# Patient Record
Sex: Male | Born: 1965 | Race: White | Hispanic: No | Marital: Married | Smoking: Current every day smoker
Health system: Southern US, Community
[De-identification: ages and names within clinical notes are randomized; demographics above are authoritative.]

## PROBLEM LIST (undated history)

## (undated) DIAGNOSIS — T07XXXA Unspecified multiple injuries, initial encounter: Secondary | ICD-10-CM

## (undated) DIAGNOSIS — J189 Pneumonia, unspecified organism: Secondary | ICD-10-CM

## (undated) DIAGNOSIS — S06309S Unspecified focal traumatic brain injury with loss of consciousness of unspecified duration, sequela: Secondary | ICD-10-CM

## (undated) DIAGNOSIS — K921 Melena: Secondary | ICD-10-CM

## (undated) DIAGNOSIS — J9621 Acute and chronic respiratory failure with hypoxia: Secondary | ICD-10-CM

## (undated) DIAGNOSIS — G4733 Obstructive sleep apnea (adult) (pediatric): Secondary | ICD-10-CM

---
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
## 2016-02-12 DIAGNOSIS — K921 Melena: Secondary | ICD-10-CM

## 2016-02-12 HISTORY — DX: Melena: K92.1

## 2018-07-13 HISTORY — PX: SPLENECTOMY: SUR1306

## 2018-07-13 HISTORY — PX: OPEN REDUCTION INTERNAL FIXATION (ORIF) TIBIA/FIBULA FRACTURE: SHX5992

## 2018-08-21 ENCOUNTER — Other Ambulatory Visit (HOSPITAL_COMMUNITY): Payer: BC Managed Care – PPO

## 2018-08-21 ENCOUNTER — Inpatient Hospital Stay
Admission: RE | Admit: 2018-08-21 | Discharge: 2018-09-08 | Disposition: A | Discharge status: Other Acute Inpatient Hospital | Payer: BC Managed Care – PPO | Source: Other Acute Inpatient Hospital | Attending: Urology | Admitting: Urology

## 2018-08-21 DIAGNOSIS — G4733 Obstructive sleep apnea (adult) (pediatric): Secondary | ICD-10-CM | POA: Diagnosis present

## 2018-08-21 DIAGNOSIS — Z4659 Encounter for fitting and adjustment of other gastrointestinal appliance and device: Secondary | ICD-10-CM

## 2018-08-21 DIAGNOSIS — J9621 Acute and chronic respiratory failure with hypoxia: Secondary | ICD-10-CM | POA: Diagnosis present

## 2018-08-21 DIAGNOSIS — Z931 Gastrostomy status: Secondary | ICD-10-CM

## 2018-08-21 DIAGNOSIS — T07XXXA Unspecified multiple injuries, initial encounter: Secondary | ICD-10-CM | POA: Diagnosis present

## 2018-08-21 DIAGNOSIS — S06309S Unspecified focal traumatic brain injury with loss of consciousness of unspecified duration, sequela: Secondary | ICD-10-CM

## 2018-08-21 DIAGNOSIS — J189 Pneumonia, unspecified organism: Secondary | ICD-10-CM | POA: Diagnosis present

## 2018-08-21 HISTORY — DX: Unspecified multiple injuries, initial encounter: T07.XXXA

## 2018-08-21 HISTORY — DX: Unspecified focal traumatic brain injury with loss of consciousness of unspecified duration, sequela: S06.309S

## 2018-08-21 HISTORY — DX: Pneumonia, unspecified organism: J18.9

## 2018-08-21 HISTORY — DX: Acute and chronic respiratory failure with hypoxia: J96.21

## 2018-08-21 HISTORY — DX: Obstructive sleep apnea (adult) (pediatric): G47.33

## 2018-08-21 LAB — URINALYSIS, ROUTINE W REFLEX MICROSCOPIC
Bilirubin Urine: NEGATIVE
Glucose, UA: NEGATIVE mg/dL
Ketones, ur: NEGATIVE mg/dL
Leukocytes,Ua: NEGATIVE
Nitrite: NEGATIVE
Protein, ur: 30 mg/dL — AB
Specific Gravity, Urine: 1.018 (ref 1.005–1.030)
pH: 8 (ref 5.0–8.0)

## 2018-08-21 LAB — C DIFFICILE QUICK SCREEN W PCR REFLEX
C Diff antigen: NEGATIVE
C Diff interpretation: NOT DETECTED
C Diff toxin: NEGATIVE

## 2018-08-22 LAB — CBC WITH DIFFERENTIAL/PLATELET
Abs Immature Granulocytes: 0.06 10*3/uL (ref 0.00–0.07)
Basophils Absolute: 0 10*3/uL (ref 0.0–0.1)
Basophils Relative: 0 %
Eosinophils Absolute: 0.7 10*3/uL — ABNORMAL HIGH (ref 0.0–0.5)
Eosinophils Relative: 6 %
HCT: 38.5 % — ABNORMAL LOW (ref 39.0–52.0)
Hemoglobin: 10.9 g/dL — ABNORMAL LOW (ref 13.0–17.0)
Immature Granulocytes: 1 %
Lymphocytes Relative: 10 %
Lymphs Abs: 1.3 10*3/uL (ref 0.7–4.0)
MCH: 29 pg (ref 26.0–34.0)
MCHC: 28.3 g/dL — ABNORMAL LOW (ref 30.0–36.0)
MCV: 102.4 fL — ABNORMAL HIGH (ref 80.0–100.0)
Monocytes Absolute: 0.9 10*3/uL (ref 0.1–1.0)
Monocytes Relative: 7 %
Neutro Abs: 9.9 10*3/uL — ABNORMAL HIGH (ref 1.7–7.7)
Neutrophils Relative %: 76 %
Platelets: 246 10*3/uL (ref 150–400)
RBC: 3.76 MIL/uL — ABNORMAL LOW (ref 4.22–5.81)
RDW: 18 % — ABNORMAL HIGH (ref 11.5–15.5)
WBC: 12.9 10*3/uL — ABNORMAL HIGH (ref 4.0–10.5)
nRBC: 0 % (ref 0.0–0.2)

## 2018-08-22 LAB — COMPREHENSIVE METABOLIC PANEL
ALT: 95 U/L — ABNORMAL HIGH (ref 0–44)
AST: 145 U/L — ABNORMAL HIGH (ref 15–41)
Albumin: 1.8 g/dL — ABNORMAL LOW (ref 3.5–5.0)
Alkaline Phosphatase: 100 U/L (ref 38–126)
Anion gap: 9 (ref 5–15)
BUN: 17 mg/dL (ref 6–20)
CO2: 21 mmol/L — ABNORMAL LOW (ref 22–32)
Calcium: 7.9 mg/dL — ABNORMAL LOW (ref 8.9–10.3)
Chloride: 110 mmol/L (ref 98–111)
Creatinine, Ser: 0.39 mg/dL — ABNORMAL LOW (ref 0.61–1.24)
GFR calc Af Amer: >60 mL/min (ref >60)
GFR calc non Af Amer: >60 mL/min (ref >60)
Glucose, Bld: 71 mg/dL (ref 70–99)
Potassium: 5.5 mmol/L — ABNORMAL HIGH (ref 3.5–5.1)
Sodium: 140 mmol/L (ref 135–145)
Total Bilirubin: 1.5 mg/dL — ABNORMAL HIGH (ref 0.3–1.2)
Total Protein: 5.3 g/dL — ABNORMAL LOW (ref 6.5–8.1)

## 2018-08-22 LAB — PHOSPHORUS: Phosphorus: 3 mg/dL (ref 2.5–4.6)

## 2018-08-22 LAB — MAGNESIUM: Magnesium: 1.7 mg/dL (ref 1.7–2.4)

## 2018-08-22 LAB — PROTIME-INR
INR: 1.1 (ref 0.8–1.2)
Prothrombin Time: 13.6 seconds (ref 11.4–15.2)

## 2018-08-22 LAB — HEMOGLOBIN A1C
Hgb A1c MFr Bld: 4.3 % — ABNORMAL LOW (ref 4.8–5.6)
Mean Plasma Glucose: 76.71 mg/dL

## 2018-08-22 LAB — TSH: TSH: 2.199 u[IU]/mL (ref 0.350–4.500)

## 2018-08-23 LAB — URINE CULTURE: Culture: <10000 — AB

## 2018-08-23 LAB — MAGNESIUM: Magnesium: 1.6 mg/dL — ABNORMAL LOW (ref 1.7–2.4)

## 2018-08-23 LAB — POTASSIUM: Potassium: 3.6 mmol/L (ref 3.5–5.1)

## 2018-08-24 ENCOUNTER — Encounter: Payer: Self-pay | Admitting: Internal Medicine

## 2018-08-24 DIAGNOSIS — J189 Pneumonia, unspecified organism: Secondary | ICD-10-CM

## 2018-08-24 DIAGNOSIS — S06309S Unspecified focal traumatic brain injury with loss of consciousness of unspecified duration, sequela: Secondary | ICD-10-CM

## 2018-08-24 DIAGNOSIS — T07XXXA Unspecified multiple injuries, initial encounter: Secondary | ICD-10-CM | POA: Diagnosis present

## 2018-08-24 DIAGNOSIS — G4733 Obstructive sleep apnea (adult) (pediatric): Secondary | ICD-10-CM | POA: Diagnosis not present

## 2018-08-24 DIAGNOSIS — J9621 Acute and chronic respiratory failure with hypoxia: Secondary | ICD-10-CM

## 2018-08-24 LAB — CULTURE, RESPIRATORY W GRAM STAIN

## 2018-08-24 MED ORDER — GENERIC EXTERNAL MEDICATION
1.00 | Status: DC
Start: 2018-08-22 — End: 2018-08-24

## 2018-08-24 MED ORDER — IPRATROPIUM-ALBUTEROL 0.5-2.5 (3) MG/3ML IN SOLN
3.00 | RESPIRATORY_TRACT | Status: DC
Start: ? — End: 2018-08-24

## 2018-08-24 MED ORDER — ASPIRIN 81 MG PO CHEW
324.00 | CHEWABLE_TABLET | ORAL | Status: DC
Start: 2018-08-22 — End: 2018-08-24

## 2018-08-24 MED ORDER — GENERIC EXTERNAL MEDICATION
2.00 | Status: DC
Start: ? — End: 2018-08-24

## 2018-08-24 MED ORDER — GENERIC EXTERNAL MEDICATION
10.00 | Status: DC
Start: 2018-08-21 — End: 2018-08-24

## 2018-08-24 MED ORDER — CHLORHEXIDINE GLUCONATE 0.12 % MT SOLN
15.00 | OROMUCOSAL | Status: DC
Start: 2018-08-21 — End: 2018-08-24

## 2018-08-24 MED ORDER — INSULIN GLARGINE 100 UNIT/ML SOLOSTAR PEN
55.00 | PEN_INJECTOR | SUBCUTANEOUS | Status: DC
Start: 2018-08-21 — End: 2018-08-24

## 2018-08-24 MED ORDER — ACETAMINOPHEN 325 MG PO TABS
325.00 | ORAL_TABLET | ORAL | Status: DC
Start: 2018-08-21 — End: 2018-08-24

## 2018-08-24 MED ORDER — CHLORHEXIDINE GLUCONATE 0.12 % MT SOLN
15.00 | OROMUCOSAL | Status: DC
Start: ? — End: 2018-08-24

## 2018-08-24 MED ORDER — INSULIN LISPRO 100 UNIT/ML ~~LOC~~ SOLN
2.00 | SUBCUTANEOUS | Status: DC
Start: 2018-08-21 — End: 2018-08-24

## 2018-08-24 MED ORDER — ENOXAPARIN SODIUM 30 MG/0.3ML ~~LOC~~ SOLN
30.00 | SUBCUTANEOUS | Status: DC
Start: 2018-08-21 — End: 2018-08-24

## 2018-08-24 MED ORDER — FA-PYRIDOXINE-CYANOCOBALAMIN 2.5-25-2 MG PO TABS
1.00 | ORAL_TABLET | ORAL | Status: DC
Start: 2018-08-22 — End: 2018-08-24

## 2018-08-24 MED ORDER — DEXTROSE 10 % IV SOLN
125.00 | INTRAVENOUS | Status: DC
Start: ? — End: 2018-08-24

## 2018-08-24 MED ORDER — INSULIN LISPRO 100 UNIT/ML ~~LOC~~ SOLN
10.00 | SUBCUTANEOUS | Status: DC
Start: 2018-08-21 — End: 2018-08-24

## 2018-08-24 MED ORDER — GENERIC EXTERNAL MEDICATION
Status: DC
Start: ? — End: 2018-08-24

## 2018-08-24 MED ORDER — POLYETHYLENE GLYCOL 3350 17 G PO PACK
17.00 | PACK | ORAL | Status: DC
Start: 2018-08-22 — End: 2018-08-24

## 2018-08-24 MED ORDER — CITALOPRAM HYDROBROMIDE 20 MG PO TABS
20.00 | ORAL_TABLET | ORAL | Status: DC
Start: 2018-08-22 — End: 2018-08-24

## 2018-08-24 MED ORDER — SENNOSIDES-DOCUSATE SODIUM 8.6-50 MG PO TABS
2.00 | ORAL_TABLET | ORAL | Status: DC
Start: 2018-08-21 — End: 2018-08-24

## 2018-08-24 MED ORDER — GENERIC EXTERNAL MEDICATION
750.00 | Status: DC
Start: 2018-08-21 — End: 2018-08-24

## 2018-08-24 MED ORDER — OXYCODONE-ACETAMINOPHEN 5-325 MG PO TABS
1.00 | ORAL_TABLET | ORAL | Status: DC
Start: ? — End: 2018-08-24

## 2018-08-24 MED ORDER — ATORVASTATIN CALCIUM 10 MG PO TABS
10.00 | ORAL_TABLET | ORAL | Status: DC
Start: 2018-08-21 — End: 2018-08-24

## 2018-08-24 MED ORDER — ERYTHROMYCIN 5 MG/GM OP OINT
TOPICAL_OINTMENT | OPHTHALMIC | Status: DC
Start: 2018-08-21 — End: 2018-08-24

## 2018-08-24 MED ORDER — GLUCOSE 40 % PO GEL
15.00 | ORAL | Status: DC
Start: ? — End: 2018-08-24

## 2018-08-24 MED ORDER — GENERIC EXTERNAL MEDICATION
20.00 | Status: DC
Start: ? — End: 2018-08-24

## 2018-08-24 MED ORDER — BACITRACIN ZINC 500 UNIT/GM EX OINT
TOPICAL_OINTMENT | CUTANEOUS | Status: DC
Start: 2018-08-21 — End: 2018-08-24

## 2018-08-24 MED ORDER — GENERIC EXTERNAL MEDICATION
Status: DC
Start: 2018-08-21 — End: 2018-08-24
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
## 2018-08-25 ENCOUNTER — Other Ambulatory Visit (HOSPITAL_COMMUNITY): Payer: BC Managed Care – PPO

## 2018-08-25 DIAGNOSIS — G4733 Obstructive sleep apnea (adult) (pediatric): Secondary | ICD-10-CM | POA: Diagnosis not present

## 2018-08-25 DIAGNOSIS — J189 Pneumonia, unspecified organism: Secondary | ICD-10-CM | POA: Diagnosis not present

## 2018-08-25 DIAGNOSIS — T07XXXA Unspecified multiple injuries, initial encounter: Secondary | ICD-10-CM | POA: Diagnosis not present

## 2018-08-25 DIAGNOSIS — J9621 Acute and chronic respiratory failure with hypoxia: Secondary | ICD-10-CM | POA: Diagnosis not present

## 2018-08-25 NOTE — Progress Notes (Signed)
IR requested by Dr. Laren Everts for possible image-guided percutaneous gastrostomy tube placement.  CT abdomen/pelvis from today reviewed by Dr. Pascal Lux who states patient is not a good candidate for percutaneous gastrostomy tube placement due to anatomy- presence of moderate volume intra-abdominal ascites (increased likelihood of post-procedural ascitic leak). Will delete order. Dr. Laren Everts aware.  IR available in future if needed.   Bea Graff Louk, PA-C 08/25/2018, 11:30 AM

## 2018-08-25 NOTE — Progress Notes (Signed)
[facility]   PULMONARY CRITICAL CARE SERVICE  PROGRESS NOTE  Date of Service: 08/25/2018  Ruthann Cancerracy Lee [facility]  ZOX:[id number]  DOB: [birth year]   DOA: 08/21/2018  Referring Physician: Carron CurieAli Hijazi, MD  HPI: Franchot Heidelbergracy Lee Abadi is a 53 y.o. male seen for follow up of Acute on Chronic Respiratory Failure.  Patient currently is on T collar on 20% FiO2 back on the wean.  Right now is tolerating PMV doing fairly well  Medications: Reviewed on Rounds  Physical Exam:  Vitals: Temperature 97.1 pulse 88 respiratory rate 23 blood pressure 170/85 saturations 97%  Ventilator Settings off the ventilator on T collar currently requiring 28% FiO2  . General: Comfortable at this time . Eyes: Grossly normal lids, irises & conjunctiva . ENT: grossly tongue is normal . Neck: no obvious mass . Cardiovascular: S1 S2 normal no gallop . Respiratory: No rhonchi no rales are noted at this time . Abdomen: soft . Skin: no rash seen on limited exam . Musculoskeletal: not rigid . Psychiatric:unable to assess . Neurologic: no seizure no involuntary movements         Lab Data:   Basic Metabolic Panel: Recent Labs  Lab 08/22/18 0503 08/23/18 0837  NA 140  --   K 5.5* 3.6  CL 110  --   CO2 21*  --   GLUCOSE 71  --   BUN 17  --   CREATININE 0.39*  --   CALCIUM 7.9*  --   MG 1.7 1.6*  PHOS 3.0  --     ABG: No results for input(s): PHART, PCO2ART, PO2ART, HCO3, O2SAT in the last 168 hours.  Liver Function Tests: Recent Labs  Lab 08/22/18 0503  AST 145*  ALT 95*  ALKPHOS 100  BILITOT 1.5*  PROT 5.3*  ALBUMIN 1.8*   No results for input(s): LIPASE, AMYLASE in the last 168 hours. No results for input(s): AMMONIA in the last 168 hours.  CBC: Recent Labs  Lab 08/22/18 0503  WBC 12.9*  NEUTROABS 9.9*  HGB 10.9*  HCT 38.5*  MCV 102.4*  PLT 246    Cardiac Enzymes: No results for input(s): CKTOTAL, CKMB, CKMBINDEX, TROPONINI in the last  168 hours.  BNP (last 3 results) No results for input(s): BNP in the last 8760 hours.  ProBNP (last 3 results) No results for input(s): PROBNP in the last 8760 hours.  Radiological Exams: Ct Abdomen Wo Contrast  Result Date: 08/25/2018 CLINICAL DATA:  Evaluate gastric anatomy prior to potential percutaneous gastrostomy tube placement. EXAM: CT ABDOMEN WITHOUT CONTRAST TECHNIQUE: Multidetector CT imaging of the abdomen was performed following the standard protocol without IV contrast. COMPARISON:  Abdominal radiograph-earlier same day; chest radiograph-earlier same day FINDINGS: Lower chest: Limited visualization of the lower thorax demonstrates trace/small bilateral effusions, left greater than right with associated bibasilar heterogeneous and consolidative airspace opacities. Cardiomegaly. No pericardial effusion. There is diffuse decreased attenuation of the intra cardiac blood pool suggestive of anemia. Hepatobiliary: Mild nodularity hepatic contour. Multiple radiopaque gallstones are seen within the gallbladder. Moderate volume intra-abdominal ascites. Pancreas: Normal noncontrast appearance of pancreas. Spleen: The spleen appears atrophic and dysmorphic potentially the sequela of remote injury. Adrenals/Urinary Tract: Punctate calcification about the left renal hilum is favored to be vascular in etiology. No definite renal stones. There is a minimal amount of grossly symmetric bilateral perinephric stranding. No urinary obstruction. Normal noncontrast appearance the bilateral adrenal glands. The urinary bladder was not imaged. Stomach/Bowel: The anterior wall of the stomach  is well apposed against the ventral wall of the upper abdomen without interposed liver or colon. Enteric tube tip terminates within the D JJ. No evidence of enteric obstruction. No pneumoperitoneum, pneumatosis or portal venous gas. Vascular/Lymphatic: Normal caliber of the abdominal aorta. No bulky retroperitoneal mesenteric  lymphadenopathy on this noncontrast examination. Other: Well apposed midline abdominal incision. Mild diffuse body wall anasarca, most conspicuous about the midline of the low back. Musculoskeletal: No acute or aggressive osseous abnormalities. IMPRESSION: 1. Given presence of moderate volume intra-abdominal ascites, patient is NOT a good candidate for percutaneous gastrostomy tube placement given likelihood of postprocedural ascitic leak. 2. Atrophic and dysmorphic appearance of the spleen, nonspecific, potentially the sequela of remote injury. 3. Trace/small bilateral effusions with associated mild diffuse body wall anasarca, nonspecific though could be seen in the setting of congestive heart failure. 4. Cholelithiasis. Electronically Signed   By: Sandi Mariscal M.D.   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 Acute on chronic respiratory failure with hypoxia we will continue with the T collar wean as tolerated trach should be changed out to a cuffless trach today 2. Traumatic intracranial hemorrhage grossly unchanged no change from a neurological perspective 3. Multiple fractures at baseline 4. Healthcare associated pneumonia treated we will continue to follow along 5. OSA has tracheostomy in place   I have personally seen and evaluated the patient, evaluated laboratory and imaging results, formulated the assessment and plan and placed orders. The Patient requires high complexity decision making for assessment and support.  Case was discussed on Rounds with the Respiratory Therapy Staff  Allyne Gee, MD [facility] Pulmonary Critical Care Medicine Sleep Medicine

## 2018-08-26 DIAGNOSIS — T07XXXA Unspecified multiple injuries, initial encounter: Secondary | ICD-10-CM | POA: Diagnosis not present

## 2018-08-26 DIAGNOSIS — J9621 Acute and chronic respiratory failure with hypoxia: Secondary | ICD-10-CM | POA: Diagnosis not present

## 2018-08-26 DIAGNOSIS — G4733 Obstructive sleep apnea (adult) (pediatric): Secondary | ICD-10-CM | POA: Diagnosis not present

## 2018-08-26 DIAGNOSIS — J189 Pneumonia, unspecified organism: Secondary | ICD-10-CM | POA: Diagnosis not present

## 2018-08-26 NOTE — Progress Notes (Addendum)
Pulmonary Critical Care Medicine [place]   PULMONARY CRITICAL CARE SERVICE  PROGRESS NOTE  Date of Service: 08/26/2018  Michael Wilkinson [facility]  OMV:[id number]  DOB: Sep 04, 1965   DOA: 08/21/2018  Referring Physician: Merton Border, MD  HPI: Michael Wilkinson is a 53 y.o. male seen for follow up of Acute on Chronic Respiratory Failure.  Patient on aerosol trach collar 28% FiO2 using PMV with no difficulty.  Medications: Reviewed on Rounds  Physical Exam:  Vitals: Pulse 72 respirations 18 BP 149/82 O2 sat 98% temp 98.0  Ventilator Settings ATC 28%  . General: Comfortable at this time . Eyes: Grossly normal lids, irises & conjunctiva . ENT: grossly tongue is normal . Neck: no obvious mass . Cardiovascular: S1 S2 normal no gallop . Respiratory: No rales or rhonchi noted . Abdomen: soft . Skin: no rash seen on limited exam . Musculoskeletal: not rigid . Psychiatric:unable to assess . Neurologic: no seizure no involuntary movements         Lab Data:   Basic Metabolic Panel: Recent Labs  Lab 08/22/18 0503 08/23/18 0837  NA 140  --   K 5.5* 3.6  CL 110  --   CO2 21*  --   GLUCOSE 71  --   BUN 17  --   CREATININE 0.39*  --   CALCIUM 7.9*  --   MG 1.7 1.6*  PHOS 3.0  --     ABG: No results for input(s): PHART, PCO2ART, PO2ART, HCO3, O2SAT in the last 168 hours.  Liver Function Tests: Recent Labs  Lab 08/22/18 0503  AST 145*  ALT 95*  ALKPHOS 100  BILITOT 1.5*  PROT 5.3*  ALBUMIN 1.8*   No results for input(s): LIPASE, AMYLASE in the last 168 hours. No results for input(s): AMMONIA in the last 168 hours.  CBC: Recent Labs  Lab 08/22/18 0503  WBC 12.9*  NEUTROABS 9.9*  HGB 10.9*  HCT 38.5*  MCV 102.4*  PLT 246    Cardiac Enzymes: No results for input(s): CKTOTAL, CKMB, CKMBINDEX, TROPONINI in the last 168 hours.  BNP (last 3 results) No results for input(s): BNP in the last 8760 hours.  ProBNP (last 3 results) No results for  input(s): PROBNP in the last 8760 hours.  Radiological Exams: Ct Abdomen Wo Contrast  Result Date: 08/25/2018 CLINICAL DATA:  Evaluate gastric anatomy prior to potential percutaneous gastrostomy tube placement. EXAM: CT ABDOMEN WITHOUT CONTRAST TECHNIQUE: Multidetector CT imaging of the abdomen was performed following the standard protocol without IV contrast. COMPARISON:  Abdominal radiograph-earlier same day; chest radiograph-earlier same day FINDINGS: Lower chest: Limited visualization of the lower thorax demonstrates trace/small bilateral effusions, left greater than right with associated bibasilar heterogeneous and consolidative airspace opacities. Cardiomegaly. No pericardial effusion. There is diffuse decreased attenuation of the intra cardiac blood pool suggestive of anemia. Hepatobiliary: Mild nodularity hepatic contour. Multiple radiopaque gallstones are seen within the gallbladder. Moderate volume intra-abdominal ascites. Pancreas: Normal noncontrast appearance of pancreas. Spleen: The spleen appears atrophic and dysmorphic potentially the sequela of remote injury. Adrenals/Urinary Tract: Punctate calcification about the left renal hilum is favored to be vascular in etiology. No definite renal stones. There is a minimal amount of grossly symmetric bilateral perinephric stranding. No urinary obstruction. Normal noncontrast appearance the bilateral adrenal glands. The urinary bladder was not imaged. Stomach/Bowel: The anterior wall of the stomach is well apposed against the ventral wall of the upper abdomen without interposed liver or colon. Enteric tube tip terminates within the D  JJ. No evidence of enteric obstruction. No pneumoperitoneum, pneumatosis or portal venous gas. Vascular/Lymphatic: Normal caliber of the abdominal aorta. No bulky retroperitoneal mesenteric lymphadenopathy on this noncontrast examination. Other: Well apposed midline abdominal incision. Mild diffuse body wall anasarca, most  conspicuous about the midline of the low back. Musculoskeletal: No acute or aggressive osseous abnormalities. IMPRESSION: 1. Given presence of moderate volume intra-abdominal ascites, patient is NOT a good candidate for percutaneous gastrostomy tube placement given likelihood of postprocedural ascitic leak. 2. Atrophic and dysmorphic appearance of the spleen, nonspecific, potentially the sequela of remote injury. 3. Trace/small bilateral effusions with associated mild diffuse body wall anasarca, nonspecific though could be seen in the setting of congestive heart failure. 4. Cholelithiasis. Electronically Signed   By: Simonne ComeJohn  Watts M.D.   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 Acute on chronic respiratory failure with hypoxia continue with aerosol trach collar trial 28% FiO2 at this time.  Continues in PMV.  Continue aggressive pulmonary toilet supportive measures. 2. Traumatic intracranial hemorrhage grossly unchanged no change from a neurological perspective 3. Multiple fractures at baseline 4. Healthcare associated pneumonia treated we will continue to follow along 5. OSA has tracheostomy in place   I have personally seen and evaluated the patient, evaluated laboratory and imaging results, formulated the assessment and plan and placed orders. The Patient requires high complexity decision making for assessment and support.  Case was discussed on Rounds with the Respiratory Therapy Staff  Yevonne PaxSaadat A Khan, MD [facility] Pulmonary Critical Care Medicine Sleep Medicine

## 2018-08-27 DIAGNOSIS — T07XXXA Unspecified multiple injuries, initial encounter: Secondary | ICD-10-CM | POA: Diagnosis not present

## 2018-08-27 DIAGNOSIS — J189 Pneumonia, unspecified organism: Secondary | ICD-10-CM | POA: Diagnosis not present

## 2018-08-27 DIAGNOSIS — J9621 Acute and chronic respiratory failure with hypoxia: Secondary | ICD-10-CM | POA: Diagnosis not present

## 2018-08-27 DIAGNOSIS — G4733 Obstructive sleep apnea (adult) (pediatric): Secondary | ICD-10-CM | POA: Diagnosis not present

## 2018-08-27 NOTE — Progress Notes (Signed)
Pulmonary Critical Care Medicine [place]   PULMONARY CRITICAL CARE SERVICE  PROGRESS NOTE  Date of Service: 08/27/2018  Michael Wilkinson [facility]  LZJ:[id number]  DOB: [birth year]   DOA: 08/21/2018  Referring Physician: Merton Border, MD  HPI: Michael Wilkinson is a 53 y.o. male seen for follow up of Acute on Chronic Respiratory Failure.  Patient comfortable right now without distress remains on T collar thick secretions still been reported  Medications: Reviewed on Rounds  Physical Exam:  Vitals: Temperature 97.2 pulse 86 respiratory 22 blood pressure 150/82 saturations 96%  Ventilator Settings off the ventilator on T collar  . General: Comfortable at this time . Eyes: Grossly normal lids, irises & conjunctiva . ENT: grossly tongue is normal . Neck: no obvious mass . Cardiovascular: S1 S2 normal no gallop . Respiratory: No rhonchi no rales are noted at this time . Abdomen: soft . Skin: no rash seen on limited exam . Musculoskeletal: not rigid . Psychiatric:unable to assess . Neurologic: no seizure no involuntary movements         Lab Data:   Basic Metabolic Panel: Recent Labs  Lab 08/22/18 0503 08/23/18 0837  NA 140  --   K 5.5* 3.6  CL 110  --   CO2 21*  --   GLUCOSE 71  --   BUN 17  --   CREATININE 0.39*  --   CALCIUM 7.9*  --   MG 1.7 1.6*  PHOS 3.0  --     ABG: No results for input(s): PHART, PCO2ART, PO2ART, HCO3, O2SAT in the last 168 hours.  Liver Function Tests: Recent Labs  Lab 08/22/18 0503  AST 145*  ALT 95*  ALKPHOS 100  BILITOT 1.5*  PROT 5.3*  ALBUMIN 1.8*   No results for input(s): LIPASE, AMYLASE in the last 168 hours. No results for input(s): AMMONIA in the last 168 hours.  CBC: Recent Labs  Lab 08/22/18 0503  WBC 12.9*  NEUTROABS 9.9*  HGB 10.9*  HCT 38.5*  MCV 102.4*  PLT 246    Cardiac Enzymes: No results for input(s): CKTOTAL, CKMB, CKMBINDEX, TROPONINI in the last 168 hours.  BNP (last 3 results) No  results for input(s): BNP in the last 8760 hours.  ProBNP (last 3 results) No results for input(s): PROBNP in the last 8760 hours.  Radiological Exams: No results found.  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 Acute on chronic respiratory failure hypoxia continue with T collar trials secretions are still quite copious not able to do diet 2. Traumatic intracranial hemorrhage grossly unchanged we will continue with supportive care 3. Multiple fractures at baseline 4. Healthcare associated pneumonia treated we will continue to monitor 5. Obstructive sleep apnea at baseline   I have personally seen and evaluated the patient, evaluated laboratory and imaging results, formulated the assessment and plan and placed orders. The Patient requires high complexity decision making for assessment and support.  Case was discussed on Rounds with the Respiratory Therapy Staff  Allyne Gee, MD [facility] Pulmonary Critical Care Medicine Sleep Medicine

## 2018-08-28 DIAGNOSIS — J9621 Acute and chronic respiratory failure with hypoxia: Secondary | ICD-10-CM | POA: Diagnosis not present

## 2018-08-28 DIAGNOSIS — J189 Pneumonia, unspecified organism: Secondary | ICD-10-CM | POA: Diagnosis not present

## 2018-08-28 DIAGNOSIS — G4733 Obstructive sleep apnea (adult) (pediatric): Secondary | ICD-10-CM | POA: Diagnosis not present

## 2018-08-28 NOTE — Progress Notes (Addendum)
Pulmonary Critical Care Medicine [place]   PULMONARY CRITICAL CARE SERVICE  PROGRESS NOTE  Date of Service: 08/28/2018  Dontrail Blackwell [facility]  HQI:[id number]  DOB: 06-07-1965   DOA: 08/21/2018  Referring Physician: Merton Border, MD  HPI: Michael Wilkinson is a 53 y.o. male seen for follow up of Acute on Chronic Respiratory Failure. PT remains on 28% ATC using PMV with no difficulty.   Medications: Reviewed on Rounds  Physical Exam:  Vitals: Pulse 88, Resp 18, bp 160/84, sat 96% and temp 98.3  Ventilator Settings 28% ATC  . General: Comfortable at this time . Eyes: Grossly normal lids, irises & conjunctiva . ENT: grossly tongue is normal . Neck: no obvious mass . Cardiovascular: S1 S2 normal no gallop . Respiratory: No rales or Ronchi noted . Abdomen: soft . Skin: no rash seen on limited exam . Musculoskeletal: not rigid . Psychiatric:unable to assess . Neurologic: no seizure no involuntary movements         Lab Data:   Basic Metabolic Panel: Recent Labs  Lab 08/22/18 0503 08/23/18 0837  NA 140  --   K 5.5* 3.6  CL 110  --   CO2 21*  --   GLUCOSE 71  --   BUN 17  --   CREATININE 0.39*  --   CALCIUM 7.9*  --   MG 1.7 1.6*  PHOS 3.0  --     ABG: No results for input(s): PHART, PCO2ART, PO2ART, HCO3, O2SAT in the last 168 hours.  Liver Function Tests: Recent Labs  Lab 08/22/18 0503  AST 145*  ALT 95*  ALKPHOS 100  BILITOT 1.5*  PROT 5.3*  ALBUMIN 1.8*   No results for input(s): LIPASE, AMYLASE in the last 168 hours. No results for input(s): AMMONIA in the last 168 hours.  CBC: Recent Labs  Lab 08/22/18 0503  WBC 12.9*  NEUTROABS 9.9*  HGB 10.9*  HCT 38.5*  MCV 102.4*  PLT 246    Cardiac Enzymes: No results for input(s): CKTOTAL, CKMB, CKMBINDEX, TROPONINI in the last 168 hours.  BNP (last 3 results) No results for input(s): BNP in the last 8760 hours.  ProBNP (last 3 results) No results for input(s): PROBNP in the last  8760 hours.  Radiological Exams: No results found.  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 Acute on chronic respiratory failure hypoxia continue with T collar tirals.  Continue aggressive pulmonary toilet.  2. Traumatic intracranial hemorrhage grossly unchanged we will continue with supportive care 3. Multiple fractures at baseline 4. Healthcare associated pneumonia treated we will continue to monitor 5. Obstructive sleep apnea at baseline   I have personally seen and evaluated the patient, evaluated laboratory and imaging results, formulated the assessment and plan and placed orders. The Patient requires high complexity decision making for assessment and support.  Case was discussed on Rounds with the Respiratory Therapy Staff  Allyne Gee, MD [facility] Pulmonary Critical Care Medicine Sleep Medicine

## 2018-08-29 DIAGNOSIS — G4733 Obstructive sleep apnea (adult) (pediatric): Secondary | ICD-10-CM | POA: Diagnosis not present

## 2018-08-29 DIAGNOSIS — J9621 Acute and chronic respiratory failure with hypoxia: Secondary | ICD-10-CM | POA: Diagnosis not present

## 2018-08-29 DIAGNOSIS — J189 Pneumonia, unspecified organism: Secondary | ICD-10-CM | POA: Diagnosis not present

## 2018-08-29 LAB — BASIC METABOLIC PANEL
Anion gap: 8 (ref 5–15)
BUN: 8 mg/dL (ref 6–20)
CO2: 27 mmol/L (ref 22–32)
Calcium: 8.7 mg/dL — ABNORMAL LOW (ref 8.9–10.3)
Chloride: 101 mmol/L (ref 98–111)
Creatinine, Ser: 0.42 mg/dL — ABNORMAL LOW (ref 0.61–1.24)
GFR calc Af Amer: >60 mL/min (ref >60)
GFR calc non Af Amer: >60 mL/min (ref >60)
Glucose, Bld: 107 mg/dL — ABNORMAL HIGH (ref 70–99)
Potassium: 4.2 mmol/L (ref 3.5–5.1)
Sodium: 136 mmol/L (ref 135–145)

## 2018-08-29 LAB — CBC
HCT: 38.7 % — ABNORMAL LOW (ref 39.0–52.0)
Hemoglobin: 11.5 g/dL — ABNORMAL LOW (ref 13.0–17.0)
MCH: 28.9 pg (ref 26.0–34.0)
MCHC: 29.7 g/dL — ABNORMAL LOW (ref 30.0–36.0)
MCV: 97.2 fL (ref 80.0–100.0)
Platelets: 395 10*3/uL (ref 150–400)
RBC: 3.98 MIL/uL — ABNORMAL LOW (ref 4.22–5.81)
RDW: 16.6 % — ABNORMAL HIGH (ref 11.5–15.5)
WBC: 10.9 10*3/uL — ABNORMAL HIGH (ref 4.0–10.5)
nRBC: 0 % (ref 0.0–0.2)

## 2018-08-29 NOTE — Progress Notes (Addendum)
Pulmonary Critical Care Medicine [place]   PULMONARY CRITICAL CARE SERVICE  PROGRESS NOTE  Date of Service: 08/29/2018  Barnes [facility]  [id number]  DOB: [birth year]   DOA: 08/21/2018  Referring Physician: Merton Border, MD  HPI: Michael Wilkinson is a 53 y.o. male seen for follow up of Acute on Chronic Respiratory Failure.  Patient continues on aerosol trach collar 20% FiO2 using MB with no difficulty has very minimal secretions at this time.  Medications: Reviewed on Rounds  Physical Exam:  Vitals: Pulse 95 respiration 22 BP 142/81 O2 sat 99% temp 98.7  Ventilator Settings ATC 28%  . General: Comfortable at this time . Eyes: Grossly normal lids, irises & conjunctiva . ENT: grossly tongue is normal . Neck: no obvious mass . Cardiovascular: S1 S2 normal no gallop . Respiratory: No rales or rhonchi noted . Abdomen: soft . Skin: no rash seen on limited exam . Musculoskeletal: not rigid . Psychiatric:unable to assess . Neurologic: no seizure no involuntary movements         Lab Data:   Basic Metabolic Panel: Recent Labs  Lab 08/23/18 0837 08/29/18 0427  NA  --  136  K 3.6 4.2  CL  --  101  CO2  --  27  GLUCOSE  --  107*  BUN  --  8  CREATININE  --  0.42*  CALCIUM  --  8.7*  MG 1.6*  --     ABG: No results for input(s): PHART, PCO2ART, PO2ART, HCO3, O2SAT in the last 168 hours.  Liver Function Tests: No results for input(s): AST, ALT, ALKPHOS, BILITOT, PROT, ALBUMIN in the last 168 hours. No results for input(s): LIPASE, AMYLASE in the last 168 hours. No results for input(s): AMMONIA in the last 168 hours.  CBC: Recent Labs  Lab 08/29/18 0427  WBC 10.9*  HGB 11.5*  HCT 38.7*  MCV 97.2  PLT 395    Cardiac Enzymes: No results for input(s): CKTOTAL, CKMB, CKMBINDEX, TROPONINI in the last 168 hours.  BNP (last 3 results) No results for input(s): BNP in the last 8760 hours.  ProBNP (last 3 results) No results for input(s):  PROBNP in the last 8760 hours.  Radiological Exams: No results found.  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 Acute on chronic respiratory failure hypoxia continue with T collar tirals.  Continue aggressive pulmonary toilet.  2. Traumatic intracranial hemorrhage grossly unchanged we will continue with supportive care 3. Multiple fractures at baseline 4. Healthcare associated pneumonia treated we will continue to monitor 5. Obstructive sleep apnea at baseline   I have personally seen and evaluated the patient, evaluated laboratory and imaging results, formulated the assessment and plan and placed orders. The Patient requires high complexity decision making for assessment and support.  Case was discussed on Rounds with the Respiratory Therapy Staff  Allyne Gee, MD [facility] Pulmonary Critical Care Medicine Sleep Medicine

## 2018-08-30 DIAGNOSIS — J9621 Acute and chronic respiratory failure with hypoxia: Secondary | ICD-10-CM | POA: Diagnosis not present

## 2018-08-30 DIAGNOSIS — J189 Pneumonia, unspecified organism: Secondary | ICD-10-CM | POA: Diagnosis not present

## 2018-08-30 DIAGNOSIS — G4733 Obstructive sleep apnea (adult) (pediatric): Secondary | ICD-10-CM | POA: Diagnosis not present

## 2018-08-30 NOTE — Progress Notes (Addendum)
Pulmonary Critical Care Medicine [place]   PULMONARY CRITICAL CARE SERVICE  PROGRESS NOTE  Date of Service: 08/30/2018  Michael Wilkinson [facility]  [id number]  DOB: 09/15/1965   DOA: 08/21/2018  Referring Physician: Merton Border, MD  HPI: Michael Wilkinson is a 53 y.o. male seen for follow up of Acute on Chronic Respiratory Failure.  Continues to do well on aerosol trach collar 28% FiO2 with PMV.  Progress to capping trials today.  Medications: Reviewed on Rounds  Physical Exam:  Vitals: Pulse 83 respirations 18 BP 130/63 O2 sat 98% 98.1  Ventilator Settings ATC 28%  . General: Comfortable at this time . Eyes: Grossly normal lids, irises & conjunctiva . ENT: grossly tongue is normal . Neck: no obvious mass . Cardiovascular: S1 S2 normal no gallop . Respiratory: No rales or rhonchi noted . Abdomen: soft . Skin: no rash seen on limited exam . Musculoskeletal: not rigid . Psychiatric:unable to assess . Neurologic: no seizure no involuntary movements         Lab Data:   Basic Metabolic Panel: Recent Labs  Lab 08/29/18 0427  NA 136  K 4.2  CL 101  CO2 27  GLUCOSE 107*  BUN 8  CREATININE 0.42*  CALCIUM 8.7*    ABG: No results for input(s): PHART, PCO2ART, PO2ART, HCO3, O2SAT in the last 168 hours.  Liver Function Tests: No results for input(s): AST, ALT, ALKPHOS, BILITOT, PROT, ALBUMIN in the last 168 hours. No results for input(s): LIPASE, AMYLASE in the last 168 hours. No results for input(s): AMMONIA in the last 168 hours.  CBC: Recent Labs  Lab 08/29/18 0427  WBC 10.9*  HGB 11.5*  HCT 38.7*  MCV 97.2  PLT 395    Cardiac Enzymes: No results for input(s): CKTOTAL, CKMB, CKMBINDEX, TROPONINI in the last 168 hours.  BNP (last 3 results) No results for input(s): BNP in the last 8760 hours.  ProBNP (last 3 results) No results for input(s): PROBNP in the last 8760 hours.  Radiological Exams: No results found.  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 Acute on chronic respiratory failure hypoxia continue with T collartirals. Continue aggressive pulmonary toilet.  2. Traumatic intracranial hemorrhage grossly unchanged we will continue with supportive care 3. Multiple fractures at baseline 4. Healthcare associated pneumonia treated we will continue to monitor 5. Obstructive sleep apnea at baseline   I have personally seen and evaluated the patient, evaluated laboratory and imaging results, formulated the assessment and plan and placed orders. The Patient requires high complexity decision making for assessment and support.  Case was discussed on Rounds with the Respiratory Therapy Staff  Allyne Gee, MD [facility] Pulmonary Critical Care Medicine Sleep Medicine

## 2018-08-31 ENCOUNTER — Other Ambulatory Visit (HOSPITAL_COMMUNITY): Payer: BC Managed Care – PPO

## 2018-08-31 DIAGNOSIS — J189 Pneumonia, unspecified organism: Secondary | ICD-10-CM | POA: Diagnosis not present

## 2018-08-31 DIAGNOSIS — J9621 Acute and chronic respiratory failure with hypoxia: Secondary | ICD-10-CM | POA: Diagnosis not present

## 2018-08-31 DIAGNOSIS — G4733 Obstructive sleep apnea (adult) (pediatric): Secondary | ICD-10-CM | POA: Diagnosis not present

## 2018-08-31 MED ORDER — IOHEXOL 300 MG/ML  SOLN
100.0000 mL | Freq: Once | INTRAMUSCULAR | Status: AC | PRN
  Administered 2018-08-31: 100 mL via INTRAVENOUS

## 2018-08-31 NOTE — Progress Notes (Signed)
Pulmonary Critical Care Medicine [place]   PULMONARY CRITICAL CARE SERVICE  PROGRESS NOTE  Date of Service: 08/31/2018  Savalas Monje [facility]  [id number]  DOB: 12/10/1965   DOA: 08/21/2018  Referring Physician: Merton Border, MD  HPI: Michael Wilkinson is a 53 y.o. male seen for follow up of Acute on Chronic Respiratory Failure.  Patient is capping on room air today will be 24 hours  Medications: Reviewed on Rounds  Physical Exam:  Vitals: Temperature 97.5 pulse 86 respiratory 15 blood pressure [birth year]1942 saturations 97%  Ventilator Settings capping of the ventilator  . General: Comfortable at this time . Eyes: Grossly normal lids, irises & conjunctiva . ENT: grossly tongue is normal . Neck: no obvious mass . Cardiovascular: S1 S2 normal no gallop . Respiratory: No rhonchi no rales are noted at this time . Abdomen: soft . Skin: no rash seen on limited exam . Musculoskeletal: not rigid . Psychiatric:unable to assess . Neurologic: no seizure no involuntary movements         Lab Data:   Basic Metabolic Panel: Recent Labs  Lab 08/29/18 0427  NA 136  K 4.2  CL 101  CO2 27  GLUCOSE 107*  BUN 8  CREATININE 0.42*  CALCIUM 8.7*    ABG: No results for input(s): PHART, PCO2ART, PO2ART, HCO3, O2SAT in the last 168 hours.  Liver Function Tests: No results for input(s): AST, ALT, ALKPHOS, BILITOT, PROT, ALBUMIN in the last 168 hours. No results for input(s): LIPASE, AMYLASE in the last 168 hours. No results for input(s): AMMONIA in the last 168 hours.  CBC: Recent Labs  Lab 08/29/18 0427  WBC 10.9*  HGB 11.5*  HCT 38.7*  MCV 97.2  PLT 395    Cardiac Enzymes: No results for input(s): CKTOTAL, CKMB, CKMBINDEX, TROPONINI in the last 168 hours.  BNP (last 3 results) No results for input(s): BNP in the last 8760 hours.  ProBNP (last 3 results) No results for input(s): PROBNP in the last 8760 hours.  Radiological Exams: No results  found.  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 Acute on chronic respiratory failure with hypoxia we will continue with capping trials patient is tolerating well. 2. Traumatic intracranial hemorrhage grossly unchanged we will continue with supportive care 3. Fractures at baseline 4. Healthcare associated pneumonia treated improving 5. Obstructive sleep apnea may need positive airway pressure if planning on decannulation   I have personally seen and evaluated the patient, evaluated laboratory and imaging results, formulated the assessment and plan and placed orders. The Patient requires high complexity decision making for assessment and support.  Case was discussed on Rounds with the Respiratory Therapy Staff  Allyne Gee, MD [facility] Pulmonary Critical Care Medicine Sleep Medicine

## 2018-09-01 ENCOUNTER — Other Ambulatory Visit (HOSPITAL_COMMUNITY): Payer: BC Managed Care – PPO

## 2018-09-01 DIAGNOSIS — J189 Pneumonia, unspecified organism: Secondary | ICD-10-CM | POA: Diagnosis not present

## 2018-09-01 DIAGNOSIS — J9621 Acute and chronic respiratory failure with hypoxia: Secondary | ICD-10-CM | POA: Diagnosis not present

## 2018-09-01 DIAGNOSIS — G4733 Obstructive sleep apnea (adult) (pediatric): Secondary | ICD-10-CM | POA: Diagnosis not present

## 2018-09-01 NOTE — Progress Notes (Signed)
[facility]   PULMONARY CRITICAL CARE SERVICE  PROGRESS NOTE  Date of Service: 09/01/2018  Ruthann [facility]  ZOX:[id number]  DOB: Nov 27, 1965   DOA: 08/21/2018  Referring Physician: Carron CurieAli Hijazi, MD  HPI: Michael Wilkinson is a 53 y.o. male seen for follow up of Acute on Chronic Respiratory Failure.  Patient has been capping for 48 hours doing fairly well  Medications: Reviewed on Rounds  Physical Exam:  Vitals: Temperature 97.7 pulse 81 respiratory 18 blood pressure 132/74 saturations 96%  Ventilator Settings capping right now off the ventilator  . General: Comfortable at this time . Eyes: Grossly normal lids, irises & conjunctiva . ENT: grossly tongue is normal . Neck: no obvious mass . Cardiovascular: S1 S2 normal no gallop . Respiratory: No rhonchi no rales are noted at this time . Abdomen: soft . Skin: no rash seen on limited exam . Musculoskeletal: not rigid . Psychiatric:unable to assess . Neurologic: no seizure no involuntary movements         Lab Data:   Basic Metabolic Panel: Recent Labs  Lab 08/29/18 0427  NA 136  K 4.2  CL 101  CO2 27  GLUCOSE 107*  BUN 8  CREATININE 0.42*  CALCIUM 8.7*    ABG: No results for input(s): PHART, PCO2ART, PO2ART, HCO3, O2SAT in the last 168 hours.  Liver Function Tests: No results for input(s): AST, ALT, ALKPHOS, BILITOT, PROT, ALBUMIN in the last 168 hours. No results for input(s): LIPASE, AMYLASE in the last 168 hours. No results for input(s): AMMONIA in the last 168 hours.  CBC: Recent Labs  Lab 08/29/18 0427  WBC 10.9*  HGB 11.5*  HCT 38.7*  MCV 97.2  PLT 395    Cardiac Enzymes: No results for input(s): CKTOTAL, CKMB, CKMBINDEX, TROPONINI in the last 168 hours.  BNP (last 3 results) No results for input(s): BNP in the last 8760 hours.  ProBNP (last 3 results) No results for input(s): PROBNP in the last 8760 hours.  Radiological Exams: Ct Head W &  Wo Contrast  Result Date: 09/01/2018 CLINICAL DATA:  Subarachnoid hemorrhage EXAM: CT HEAD WITHOUT AND WITH CONTRAST TECHNIQUE: Contiguous axial images were obtained from the base of the skull through the vertex without and with intravenous contrast CONTRAST:  100mL OMNIPAQUE IOHEXOL 300 MG/ML  SOLN COMPARISON:  None available at the time of interpretation. FINDINGS: Brain: Trace subarachnoid blood within the occipital horns of both lateral ventricles. No other hemorrhage. No midline shift or other mass effect. There is periventricular hypoattenuation compatible with chronic microvascular disease. Mild generalized volume loss, slightly advanced for age. There is encephalomalacia of the inferior right frontal lobe, likely related to remote trauma. There is faint contrast enhancement at the site, likely secondary to blood brain barrier breakdown. Vascular: No abnormal hyperdensity of the major intracranial arteries or dural venous sinuses. No intracranial atherosclerosis. Skull: Comminuted fracture of the left zygomatic arch. Medially displaced fracture of the right nasal bone. Sinuses/Orbits: Bilateral mastoid fluid. No paranasal sinus fluid levels. Other: None IMPRESSION: 1. Trace subarachnoid blood within the occipital horns of the lateral ventricles. No other hemorrhage. 2. 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 3. Mildly age advanced volume loss. Electronically Signed   By: Deatra RobinsonKevin  Herman M.D.   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 Acute on chronic respiratory failure with hypoxia continue with capping advance as tolerated 2. Traumatic intracranial hemorrhage unchanged we will continue to follow 3. Multiple fracture at  baseline 4. Healthcare associated pneumonia treated 5. Obstructive sleep apnea grossly unchanged   I have personally seen and evaluated the patient, evaluated laboratory and imaging results, formulated the assessment and plan and placed orders. The Patient requires high complexity decision making for assessment and support.  Case was discussed on Rounds with the Respiratory Therapy Staff  Allyne Gee, MD [facility] Pulmonary Critical Care Medicine Sleep Medicine

## 2018-09-02 DIAGNOSIS — G4733 Obstructive sleep apnea (adult) (pediatric): Secondary | ICD-10-CM | POA: Diagnosis not present

## 2018-09-02 DIAGNOSIS — J189 Pneumonia, unspecified organism: Secondary | ICD-10-CM | POA: Diagnosis not present

## 2018-09-02 DIAGNOSIS — J9621 Acute and chronic respiratory failure with hypoxia: Secondary | ICD-10-CM | POA: Diagnosis not present

## 2018-09-02 NOTE — Progress Notes (Signed)
[facility]   PULMONARY CRITICAL CARE SERVICE  PROGRESS NOTE  Date of Service: 09/02/2018  [facility]  ZOX:[id number]  DOB: [birth year]   DOA: 08/21/2018  Referring Physician: Carron CurieAli Hijazi, MD  HPI: Michael Wilkinson is a 53 y.o. male seen for follow up of Acute on Chronic Respiratory Failure.  Patient is capping right now on 72-hour goal today  Medications: Reviewed on Rounds  Physical Exam:  Vitals: Temperature 97.3 pulse 91 respiratory rate 18 blood pressure 123/69 saturations 92%  Ventilator Settings capping for 72 hours  . General: Comfortable at this time . Eyes: Grossly normal lids, irises & conjunctiva . ENT: grossly tongue is normal . Neck: no obvious mass . Cardiovascular: S1 S2 normal no gallop . Respiratory: No rhonchi no rales are noted at this time . Abdomen: soft . Skin: no rash seen on limited exam . Musculoskeletal: not rigid . Psychiatric:unable to assess . Neurologic: no seizure no involuntary movements         Lab Data:   Basic Metabolic Panel: Recent Labs  Lab 08/29/18 0427  NA 136  K 4.2  CL 101  CO2 27  GLUCOSE 107*  BUN 8  CREATININE 0.42*  CALCIUM 8.7*    ABG: No results for input(s): PHART, PCO2ART, PO2ART, HCO3, O2SAT in the last 168 hours.  Liver Function Tests: No results for input(s): AST, ALT, ALKPHOS, BILITOT, PROT, ALBUMIN in the last 168 hours. No results for input(s): LIPASE, AMYLASE in the last 168 hours. No results for input(s): AMMONIA in the last 168 hours.  CBC: Recent Labs  Lab 08/29/18 0427  WBC 10.9*  HGB 11.5*  HCT 38.7*  MCV 97.2  PLT 395    Cardiac Enzymes: No results for input(s): CKTOTAL, CKMB, CKMBINDEX, TROPONINI in the last 168 hours.  BNP (last 3 results) No results for input(s): BNP in the last 8760 hours.  ProBNP (last 3 results) No results for input(s): PROBNP in the last 8760 hours.  Radiological Exams: Ct Head W & Wo  Contrast  Result Date: 09/01/2018 CLINICAL DATA:  Subarachnoid hemorrhage EXAM: CT HEAD WITHOUT AND WITH CONTRAST TECHNIQUE: Contiguous axial images were obtained from the base of the skull through the vertex without and with intravenous contrast CONTRAST:  100mL OMNIPAQUE IOHEXOL 300 MG/ML  SOLN COMPARISON:  None available at the time of interpretation. FINDINGS: Brain: Trace subarachnoid blood within the occipital horns of both lateral ventricles. No other hemorrhage. No midline shift or other mass effect. There is periventricular hypoattenuation compatible with chronic microvascular disease. Mild generalized volume loss, slightly advanced for age. There is encephalomalacia of the inferior right frontal lobe, likely related to remote trauma. There is faint contrast enhancement at the site, likely secondary to blood brain barrier breakdown. Vascular: No abnormal hyperdensity of the major intracranial arteries or dural venous sinuses. No intracranial atherosclerosis. Skull: Comminuted fracture of the left zygomatic arch. Medially displaced fracture of the right nasal bone. Sinuses/Orbits: Bilateral mastoid fluid. No paranasal sinus fluid levels. Other: None IMPRESSION: 1. Trace subarachnoid blood within the occipital horns of the lateral ventricles. No other hemorrhage. 2. 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 3. Mildly age advanced volume loss. Electronically Signed   By: Deatra RobinsonKevin  Herman M.D.   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 Acute on chronic respiratory failure with hypoxia we will proceed to decannulation 2. Traumatic intracranial hemorrhage unchanged 3. Multiple fractures at baseline 4. Healthcare associated pneumonia  treated 5. OSA at baseline continue present management   I have personally seen and evaluated the patient, evaluated laboratory and imaging results, formulated the assessment and plan and placed orders. The Patient requires high complexity decision making for assessment and support.  Case was discussed on Rounds with the Respiratory Therapy Staff  Allyne Gee, MD [facility] Pulmonary Critical Care Medicine Sleep Medicine

## 2018-09-03 DIAGNOSIS — G4733 Obstructive sleep apnea (adult) (pediatric): Secondary | ICD-10-CM | POA: Diagnosis not present

## 2018-09-03 DIAGNOSIS — J9621 Acute and chronic respiratory failure with hypoxia: Secondary | ICD-10-CM | POA: Diagnosis not present

## 2018-09-03 DIAGNOSIS — J189 Pneumonia, unspecified organism: Secondary | ICD-10-CM | POA: Diagnosis not present

## 2018-09-03 NOTE — Progress Notes (Addendum)
Pulmonary Critical Care Medicine [place]   PULMONARY CRITICAL CARE SERVICE  PROGRESS NOTE  Date of Service: 09/03/2018  Michael Wilkinson [facility]  [id number]  DOB: 1965-09-30   DOA: 08/21/2018  Referring Physician: Merton Border, MD  HPI: Michael Wilkinson is a 53 y.o. male seen for follow up of Acute on Chronic Respiratory Failure.  Patient was decannulated yesterday is on room air satting well with no distress.  Medications: Reviewed on Rounds  Physical Exam:  Vitals: Pulse 90 respirations 18 BP 129/72 O2 sat 92% temp 97.3  Ventilator Settings room air  . General: Comfortable at this time . Eyes: Grossly normal lids, irises & conjunctiva . ENT: grossly tongue is normal . Neck: no obvious mass . Cardiovascular: S1 S2 normal no gallop . Respiratory: No rales or rhonchi noted . Abdomen: soft . Skin: no rash seen on limited exam . Musculoskeletal: not rigid . Psychiatric:unable to assess . Neurologic: no seizure no involuntary movements         Lab Data:   Basic Metabolic Panel: Recent Labs  Lab 08/29/18 0427  NA 136  K 4.2  CL 101  CO2 27  GLUCOSE 107*  BUN 8  CREATININE 0.42*  CALCIUM 8.7*    ABG: No results for input(s): PHART, PCO2ART, PO2ART, HCO3, O2SAT in the last 168 hours.  Liver Function Tests: No results for input(s): AST, ALT, ALKPHOS, BILITOT, PROT, ALBUMIN in the last 168 hours. No results for input(s): LIPASE, AMYLASE in the last 168 hours. No results for input(s): AMMONIA in the last 168 hours.  CBC: Recent Labs  Lab 08/29/18 0427  WBC 10.9*  HGB 11.5*  HCT 38.7*  MCV 97.2  PLT 395    Cardiac Enzymes: No results for input(s): CKTOTAL, CKMB, CKMBINDEX, TROPONINI in the last 168 hours.  BNP (last 3 results) No results for input(s): BNP in the last 8760 hours.  ProBNP (last 3 results) No results for input(s): PROBNP in the last 8760 hours.  Radiological Exams: No results found.  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 Acute on chronic respiratory failure with hypoxia patient was decannulated doing well this time continue on room air. 2. Traumatic intracranial hemorrhage unchanged 3. Multiple fractures at baseline 4. Healthcare associated pneumonia treated 5. OSA at baseline continue present management   I have personally seen and evaluated the patient, evaluated laboratory and imaging results, formulated the assessment and plan and placed orders. The Patient requires high complexity decision making for assessment and support.  Case was discussed on Rounds with the Respiratory Therapy Staff  Allyne Gee, MD [facility] Pulmonary Critical Care Medicine Sleep Medicine

## 2018-09-04 ENCOUNTER — Encounter: Payer: Self-pay | Admitting: Occupational Therapy

## 2018-09-04 DIAGNOSIS — J189 Pneumonia, unspecified organism: Secondary | ICD-10-CM | POA: Diagnosis not present

## 2018-09-04 DIAGNOSIS — G4733 Obstructive sleep apnea (adult) (pediatric): Secondary | ICD-10-CM | POA: Diagnosis not present

## 2018-09-04 DIAGNOSIS — J9621 Acute and chronic respiratory failure with hypoxia: Secondary | ICD-10-CM | POA: Diagnosis not present
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
## 2018-09-06 LAB — CBC
HCT: 39.5 % (ref 39.0–52.0)
Hemoglobin: 12.3 g/dL — ABNORMAL LOW (ref 13.0–17.0)
MCH: 28.5 pg (ref 26.0–34.0)
MCHC: 31.1 g/dL (ref 30.0–36.0)
MCV: 91.4 fL (ref 80.0–100.0)
Platelets: 649 10*3/uL — ABNORMAL HIGH (ref 150–400)
RBC: 4.32 MIL/uL (ref 4.22–5.81)
RDW: 16.1 % — ABNORMAL HIGH (ref 11.5–15.5)
WBC: 11.3 10*3/uL — ABNORMAL HIGH (ref 4.0–10.5)
nRBC: 0 % (ref 0.0–0.2)

## 2018-09-06 LAB — BASIC METABOLIC PANEL
Anion gap: 12 (ref 5–15)
BUN: 7 mg/dL (ref 6–20)
CO2: 23 mmol/L (ref 22–32)
Calcium: 9.1 mg/dL (ref 8.9–10.3)
Chloride: 104 mmol/L (ref 98–111)
Creatinine, Ser: 0.54 mg/dL — ABNORMAL LOW (ref 0.61–1.24)
GFR calc Af Amer: >60 mL/min (ref >60)
GFR calc non Af Amer: >60 mL/min (ref >60)
Glucose, Bld: 91 mg/dL (ref 70–99)
Potassium: 3.5 mmol/L (ref 3.5–5.1)
Sodium: 139 mmol/L (ref 135–145)

## 2018-09-07 DIAGNOSIS — J189 Pneumonia, unspecified organism: Secondary | ICD-10-CM | POA: Diagnosis not present

## 2018-09-07 DIAGNOSIS — J9621 Acute and chronic respiratory failure with hypoxia: Secondary | ICD-10-CM | POA: Diagnosis not present

## 2018-09-07 DIAGNOSIS — G4733 Obstructive sleep apnea (adult) (pediatric): Secondary | ICD-10-CM | POA: Diagnosis not present

## 2018-09-07 LAB — SARS CORONAVIRUS 2 BY RT PCR (HOSPITAL ORDER, PERFORMED IN ~~LOC~~ HOSPITAL LAB): SARS Coronavirus 2: NEGATIVE

## 2018-09-07 NOTE — Progress Notes (Signed)
Pulmonary Critical Care Medicine [place]   PULMONARY CRITICAL CARE SERVICE  PROGRESS NOTE  Date of Service: 09/07/2018  Michael [facility]  PTW:[id number]  DOB: [birth year]   DOA: 08/21/2018  Referring Physician: Merton Border, MD  HPI: Michael Wilkinson is a 53 y.o. male seen for follow up of Acute on Chronic Respiratory Failure.  Patient currently is decannulated doing fairly well the stoma still has not completely closed  Medications: Reviewed on Rounds  Physical Exam:  Vitals: Temperature 95.6 pulse 85 respiratory rate 16 blood pressure 132/74 saturations 97%  Ventilator Settings decannulated on nasal cannula  . General: Comfortable at this time . Eyes: Grossly normal lids, irises & conjunctiva . ENT: grossly tongue is normal . Neck: no obvious mass . Cardiovascular: S1 S2 normal no gallop . Respiratory: No rhonchi no rales are noted . Abdomen: soft . Skin: no rash seen on limited exam . Musculoskeletal: not rigid . Psychiatric:unable to assess . Neurologic: no seizure no involuntary movements         Lab Data:   Basic Metabolic Panel: Recent Labs  Lab 09/06/18 0845  NA 139  K 3.5  CL 104  CO2 23  GLUCOSE 91  BUN 7  CREATININE 0.54*  CALCIUM 9.1    ABG: No results for input(s): PHART, PCO2ART, PO2ART, HCO3, O2SAT in the last 168 hours.  Liver Function Tests: No results for input(s): AST, ALT, ALKPHOS, BILITOT, PROT, ALBUMIN in the last 168 hours. No results for input(s): LIPASE, AMYLASE in the last 168 hours. No results for input(s): AMMONIA in the last 168 hours.  CBC: Recent Labs  Lab 09/06/18 0845  WBC 11.3*  HGB 12.3*  HCT 39.5  MCV 91.4  PLT 649*    Cardiac Enzymes: No results for input(s): CKTOTAL, CKMB, CKMBINDEX, TROPONINI in the last 168 hours.  BNP (last 3 results) No results for input(s): BNP in the last 8760 hours.  ProBNP (last 3 results) No results for input(s): PROBNP in the last 8760 hours.  Radiological  Exams: No results found.  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 Acute on chronic respiratory failure with hypoxia we will continue with on supportive care continue pulmonary toilet monitor the stoma 2. Traumatic intracranial hemorrhage grossly unchanged 3. Multiple fractures at baseline 4. Healthcare associated pneumonia treated improved 5. Obstructive sleep apnea patient is actually doing well we will continue to monitor closely.   I have personally seen and evaluated the patient, evaluated laboratory and imaging results, formulated the assessment and plan and placed orders. The Patient requires high complexity decision making for assessment and support.  Case was discussed on Rounds with the Respiratory Therapy Staff  Allyne Gee, MD [facility] Pulmonary Critical Care Medicine Sleep Medicine

## 2018-09-08 ENCOUNTER — Encounter: Payer: Self-pay | Admitting: Physical Medicine and Rehabilitation

## 2018-09-08 ENCOUNTER — Inpatient Hospital Stay (HOSPITAL_COMMUNITY)
Admission: RE | Admit: 2018-09-08 | Discharge: 2018-09-18 | DRG: 091 | Disposition: A | Discharge status: Home Health | Payer: BC Managed Care – PPO | Source: Other Acute Inpatient Hospital | Attending: Physical Medicine & Rehabilitation | Admitting: Physical Medicine & Rehabilitation

## 2018-09-08 ENCOUNTER — Encounter (HOSPITAL_COMMUNITY): Payer: Self-pay

## 2018-09-08 ENCOUNTER — Other Ambulatory Visit: Payer: Self-pay

## 2018-09-08 DIAGNOSIS — S069XAA Unspecified intracranial injury with loss of consciousness status unknown, initial encounter: Secondary | ICD-10-CM | POA: Diagnosis present

## 2018-09-08 DIAGNOSIS — T148XXA Other injury of unspecified body region, initial encounter: Secondary | ICD-10-CM | POA: Diagnosis not present

## 2018-09-08 DIAGNOSIS — Z881 Allergy status to other antibiotic agents status: Secondary | ICD-10-CM | POA: Diagnosis not present

## 2018-09-08 DIAGNOSIS — E876 Hypokalemia: Secondary | ICD-10-CM | POA: Diagnosis present

## 2018-09-08 DIAGNOSIS — S069X3S Unspecified intracranial injury with loss of consciousness of 1 hour to 5 hours 59 minutes, sequela: Secondary | ICD-10-CM | POA: Diagnosis not present

## 2018-09-08 DIAGNOSIS — S069X2S Unspecified intracranial injury with loss of consciousness of 31 minutes to 59 minutes, sequela: Secondary | ICD-10-CM

## 2018-09-08 DIAGNOSIS — S82402S Unspecified fracture of shaft of left fibula, sequela: Secondary | ICD-10-CM

## 2018-09-08 DIAGNOSIS — R4689 Other symptoms and signs involving appearance and behavior: Secondary | ICD-10-CM | POA: Diagnosis not present

## 2018-09-08 DIAGNOSIS — S069X9A Unspecified intracranial injury with loss of consciousness of unspecified duration, initial encounter: Secondary | ICD-10-CM | POA: Diagnosis present

## 2018-09-08 DIAGNOSIS — D62 Acute posthemorrhagic anemia: Secondary | ICD-10-CM

## 2018-09-08 DIAGNOSIS — R52 Pain, unspecified: Secondary | ICD-10-CM | POA: Diagnosis not present

## 2018-09-08 DIAGNOSIS — S82202S Unspecified fracture of shaft of left tibia, sequela: Secondary | ICD-10-CM | POA: Diagnosis not present

## 2018-09-08 DIAGNOSIS — S065X9S Traumatic subdural hemorrhage with loss of consciousness of unspecified duration, sequela: Principal | ICD-10-CM

## 2018-09-08 DIAGNOSIS — R1310 Dysphagia, unspecified: Secondary | ICD-10-CM | POA: Diagnosis present

## 2018-09-08 DIAGNOSIS — R4189 Other symptoms and signs involving cognitive functions and awareness: Secondary | ICD-10-CM | POA: Diagnosis present

## 2018-09-08 DIAGNOSIS — G4733 Obstructive sleep apnea (adult) (pediatric): Secondary | ICD-10-CM | POA: Diagnosis present

## 2018-09-08 DIAGNOSIS — S52515D Nondisplaced fracture of left radial styloid process, subsequent encounter for closed fracture with routine healing: Secondary | ICD-10-CM

## 2018-09-08 DIAGNOSIS — I214 Non-ST elevation (NSTEMI) myocardial infarction: Secondary | ICD-10-CM | POA: Diagnosis present

## 2018-09-08 DIAGNOSIS — Z9081 Acquired absence of spleen: Secondary | ICD-10-CM | POA: Diagnosis not present

## 2018-09-08 DIAGNOSIS — S069X9S Unspecified intracranial injury with loss of consciousness of unspecified duration, sequela: Secondary | ICD-10-CM | POA: Diagnosis not present

## 2018-09-08 DIAGNOSIS — S02609D Fracture of mandible, unspecified, subsequent encounter for fracture with routine healing: Secondary | ICD-10-CM | POA: Diagnosis not present

## 2018-09-08 DIAGNOSIS — S069X4D Unspecified intracranial injury with loss of consciousness of 6 hours to 24 hours, subsequent encounter: Secondary | ICD-10-CM | POA: Diagnosis not present

## 2018-09-08 DIAGNOSIS — Z930 Tracheostomy status: Secondary | ICD-10-CM | POA: Diagnosis not present

## 2018-09-08 DIAGNOSIS — R1312 Dysphagia, oropharyngeal phase: Secondary | ICD-10-CM | POA: Diagnosis not present

## 2018-09-08 DIAGNOSIS — H189 Unspecified disorder of cornea: Secondary | ICD-10-CM | POA: Diagnosis present

## 2018-09-08 DIAGNOSIS — S066X9D Traumatic subarachnoid hemorrhage with loss of consciousness of unspecified duration, subsequent encounter: Secondary | ICD-10-CM

## 2018-09-08 DIAGNOSIS — Z7982 Long term (current) use of aspirin: Secondary | ICD-10-CM | POA: Diagnosis not present

## 2018-09-08 DIAGNOSIS — S31109S Unspecified open wound of abdominal wall, unspecified quadrant without penetration into peritoneal cavity, sequela: Secondary | ICD-10-CM

## 2018-09-08 DIAGNOSIS — Z430 Encounter for attention to tracheostomy: Secondary | ICD-10-CM

## 2018-09-08 DIAGNOSIS — Z794 Long term (current) use of insulin: Secondary | ICD-10-CM

## 2018-09-08 DIAGNOSIS — S069X0S Unspecified intracranial injury without loss of consciousness, sequela: Secondary | ICD-10-CM | POA: Diagnosis not present

## 2018-09-08 MED ORDER — AMLODIPINE BESYLATE 10 MG PO TABS
10.0000 mg | ORAL_TABLET | Freq: Every day | ORAL | Status: DC
  Administered 2018-09-09 – 2018-09-18 (×10): 10 mg via ORAL
  Filled 2018-09-08 (×5): qty 1
  Filled 2018-09-08: qty 2
  Filled 2018-09-08 (×4): qty 1

## 2018-09-08 MED ORDER — PROCHLORPERAZINE EDISYLATE 10 MG/2ML IJ SOLN: 5.0000 mg | Freq: Four times a day (QID) | INTRAMUSCULAR | Status: DC | PRN

## 2018-09-08 MED ORDER — ENOXAPARIN SODIUM 40 MG/0.4ML ~~LOC~~ SOLN
40.0000 mg | SUBCUTANEOUS | Status: DC
  Administered 2018-09-08 – 2018-09-17 (×10): 40 mg via SUBCUTANEOUS
  Filled 2018-09-08 (×10): qty 0.4

## 2018-09-08 MED ORDER — PROCHLORPERAZINE MALEATE 5 MG PO TABS
5.0000 mg | ORAL_TABLET | Freq: Four times a day (QID) | ORAL | Status: DC | PRN
  Administered 2018-09-11: 13:00:00 10 mg via ORAL
  Filled 2018-09-08: qty 2

## 2018-09-08 MED ORDER — AMLODIPINE BESYLATE 5 MG PO TABS: 5.0000 mg | ORAL_TABLET | Freq: Every day | ORAL | Status: DC

## 2018-09-08 MED ORDER — ERYTHROMYCIN 5 MG/GM OP OINT
TOPICAL_OINTMENT | OPHTHALMIC | Status: DC
  Administered 2018-09-08 – 2018-09-11 (×35): via OPHTHALMIC
  Administered 2018-09-11: 1 via OPHTHALMIC
  Administered 2018-09-11 (×2): via OPHTHALMIC
  Administered 2018-09-12 (×3): 1 via OPHTHALMIC
  Administered 2018-09-12 (×3): via OPHTHALMIC
  Administered 2018-09-12: 1 via OPHTHALMIC
  Administered 2018-09-12 – 2018-09-13 (×5): via OPHTHALMIC
  Filled 2018-09-08: qty 3.5

## 2018-09-08 MED ORDER — ERYTHROMYCIN 5 MG/GM OP OINT
TOPICAL_OINTMENT | Freq: Two times a day (BID) | OPHTHALMIC | Status: DC
  Administered 2018-09-08 – 2018-09-10 (×5): via OPHTHALMIC
  Administered 2018-09-11: 1 via OPHTHALMIC
  Administered 2018-09-11: 08:00:00 via OPHTHALMIC
  Administered 2018-09-12: 1 via OPHTHALMIC
  Filled 2018-09-08: qty 3.5

## 2018-09-08 MED ORDER — POLYETHYLENE GLYCOL 3350 17 G PO PACK
17.0000 g | PACK | Freq: Every day | ORAL | Status: DC | PRN
  Filled 2018-09-08: qty 1

## 2018-09-08 MED ORDER — OXYCODONE HCL 5 MG PO TABS
5.0000 mg | ORAL_TABLET | ORAL | Status: DC | PRN
  Administered 2018-09-10 – 2018-09-17 (×2): 5 mg via ORAL
  Filled 2018-09-08 (×2): qty 1

## 2018-09-08 MED ORDER — TOPIRAMATE 25 MG PO TABS
25.0000 mg | ORAL_TABLET | Freq: Two times a day (BID) | ORAL | Status: DC
  Administered 2018-09-08 – 2018-09-18 (×20): 25 mg via ORAL
  Filled 2018-09-08 (×20): qty 1

## 2018-09-08 MED ORDER — LEVETIRACETAM 750 MG PO TABS
750.0000 mg | ORAL_TABLET | Freq: Two times a day (BID) | ORAL | Status: DC
  Administered 2018-09-08 – 2018-09-18 (×20): 750 mg via ORAL
  Filled 2018-09-08 (×14): qty 1
  Filled 2018-09-08: qty 3
  Filled 2018-09-08 (×5): qty 1

## 2018-09-08 MED ORDER — PROCHLORPERAZINE 25 MG RE SUPP
12.5000 mg | Freq: Four times a day (QID) | RECTAL | Status: DC | PRN
  Filled 2018-09-08: qty 1

## 2018-09-08 MED ORDER — DIPHENHYDRAMINE HCL 12.5 MG/5ML PO ELIX: 12.5000 mg | ORAL_SOLUTION | Freq: Four times a day (QID) | ORAL | Status: DC | PRN

## 2018-09-08 MED ORDER — BISACODYL 10 MG RE SUPP: 10.0000 mg | Freq: Every day | RECTAL | Status: DC | PRN

## 2018-09-08 MED ORDER — ALUM & MAG HYDROXIDE-SIMETH 200-200-20 MG/5ML PO SUSP: 30.0000 mL | ORAL | Status: DC | PRN

## 2018-09-08 MED ORDER — MODAFINIL 100 MG PO TABS
100.0000 mg | ORAL_TABLET | Freq: Every day | ORAL | Status: DC
  Administered 2018-09-09: 08:00:00 100 mg via ORAL
  Filled 2018-09-08: qty 1

## 2018-09-08 MED ORDER — TRAZODONE HCL 50 MG PO TABS
25.0000 mg | ORAL_TABLET | Freq: Every evening | ORAL | Status: DC | PRN
  Administered 2018-09-14 – 2018-09-16 (×3): 50 mg via ORAL
  Filled 2018-09-08 (×3): qty 1

## 2018-09-08 MED ORDER — AMANTADINE HCL 100 MG PO CAPS
100.0000 mg | ORAL_CAPSULE | Freq: Every day | ORAL | Status: DC
  Administered 2018-09-09 – 2018-09-15 (×7): 100 mg via ORAL
  Filled 2018-09-08 (×7): qty 1

## 2018-09-08 MED ORDER — METHOCARBAMOL 500 MG PO TABS: 500.0000 mg | ORAL_TABLET | Freq: Four times a day (QID) | ORAL | Status: DC | PRN

## 2018-09-08 MED ORDER — ADULT MULTIVITAMIN W/MINERALS CH
1.0000 | ORAL_TABLET | Freq: Every day | ORAL | Status: DC
  Administered 2018-09-09 – 2018-09-18 (×10): 1 via ORAL
  Filled 2018-09-08 (×10): qty 1

## 2018-09-08 MED ORDER — SERTRALINE HCL 50 MG PO TABS
25.0000 mg | ORAL_TABLET | Freq: Every day | ORAL | Status: DC
  Administered 2018-09-09 – 2018-09-15 (×7): 25 mg via ORAL
  Filled 2018-09-08 (×7): qty 1

## 2018-09-08 MED ORDER — ATORVASTATIN CALCIUM 10 MG PO TABS
10.0000 mg | ORAL_TABLET | Freq: Every day | ORAL | Status: DC
  Administered 2018-09-08 – 2018-09-17 (×10): 10 mg via ORAL
  Filled 2018-09-08 (×10): qty 1

## 2018-09-08 MED ORDER — TRAMADOL HCL 50 MG PO TABS
50.0000 mg | ORAL_TABLET | Freq: Four times a day (QID) | ORAL | Status: DC | PRN
  Administered 2018-09-14 – 2018-09-18 (×2): 50 mg via ORAL
  Filled 2018-09-08 (×2): qty 1

## 2018-09-08 MED ORDER — FLEET ENEMA 7-19 GM/118ML RE ENEM: 1.0000 | ENEMA | Freq: Once | RECTAL | Status: DC | PRN

## 2018-09-08 MED ORDER — GUAIFENESIN-DM 100-10 MG/5ML PO SYRP: 5.0000 mL | ORAL_SOLUTION | Freq: Four times a day (QID) | ORAL | Status: DC | PRN

## 2018-09-08 MED ORDER — ACETAMINOPHEN 325 MG PO TABS
325.0000 mg | ORAL_TABLET | ORAL | Status: DC | PRN
  Administered 2018-09-08 – 2018-09-09 (×2): 325 mg via ORAL
  Administered 2018-09-09 – 2018-09-10 (×2): 650 mg via ORAL
  Administered 2018-09-10: 03:00:00 325 mg via ORAL
  Administered 2018-09-11 – 2018-09-15 (×6): 650 mg via ORAL
  Filled 2018-09-08 (×2): qty 2
  Filled 2018-09-08: qty 1
  Filled 2018-09-08 (×7): qty 2
  Filled 2018-09-08 (×2): qty 1
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
## 2018-09-08 NOTE — Progress Notes (Signed)
Patient arrived to unit, no s/s of distress. No  Complications noted at this time, will continue plan of care. Audie Clear, LPN

## 2018-09-09 ENCOUNTER — Inpatient Hospital Stay (HOSPITAL_COMMUNITY): Payer: BC Managed Care – PPO | Admitting: Speech Pathology

## 2018-09-09 ENCOUNTER — Inpatient Hospital Stay (HOSPITAL_COMMUNITY): Payer: BC Managed Care – PPO | Admitting: Physical Therapy

## 2018-09-09 ENCOUNTER — Inpatient Hospital Stay (HOSPITAL_COMMUNITY): Payer: BC Managed Care – PPO

## 2018-09-09 DIAGNOSIS — S069X3S Unspecified intracranial injury with loss of consciousness of 1 hour to 5 hours 59 minutes, sequela: Secondary | ICD-10-CM

## 2018-09-09 DIAGNOSIS — Z930 Tracheostomy status: Secondary | ICD-10-CM

## 2018-09-09 DIAGNOSIS — E876 Hypokalemia: Secondary | ICD-10-CM

## 2018-09-09 LAB — CBC WITH DIFFERENTIAL/PLATELET
Abs Immature Granulocytes: 0.04 10*3/uL (ref 0.00–0.07)
Basophils Absolute: 0.1 10*3/uL (ref 0.0–0.1)
Basophils Relative: 1 %
Eosinophils Absolute: 1.3 10*3/uL — ABNORMAL HIGH (ref 0.0–0.5)
Eosinophils Relative: 14 %
HCT: 42.2 % (ref 39.0–52.0)
Hemoglobin: 13 g/dL (ref 13.0–17.0)
Immature Granulocytes: 0 %
Lymphocytes Relative: 15 %
Lymphs Abs: 1.4 10*3/uL (ref 0.7–4.0)
MCH: 27.8 pg (ref 26.0–34.0)
MCHC: 30.8 g/dL (ref 30.0–36.0)
MCV: 90.4 fL (ref 80.0–100.0)
Monocytes Absolute: 0.9 10*3/uL (ref 0.1–1.0)
Monocytes Relative: 9 %
Neutro Abs: 5.6 10*3/uL (ref 1.7–7.7)
Neutrophils Relative %: 61 %
Platelets: 657 10*3/uL — ABNORMAL HIGH (ref 150–400)
RBC: 4.67 MIL/uL (ref 4.22–5.81)
RDW: 15.9 % — ABNORMAL HIGH (ref 11.5–15.5)
WBC: 9.2 10*3/uL (ref 4.0–10.5)
nRBC: 0 % (ref 0.0–0.2)

## 2018-09-09 LAB — COMPREHENSIVE METABOLIC PANEL
ALT: 15 U/L (ref 0–44)
AST: 22 U/L (ref 15–41)
Albumin: 2.6 g/dL — ABNORMAL LOW (ref 3.5–5.0)
Alkaline Phosphatase: 84 U/L (ref 38–126)
Anion gap: 11 (ref 5–15)
BUN: <5 mg/dL — ABNORMAL LOW (ref 6–20)
CO2: 24 mmol/L (ref 22–32)
Calcium: 9.3 mg/dL (ref 8.9–10.3)
Chloride: 103 mmol/L (ref 98–111)
Creatinine, Ser: 0.58 mg/dL — ABNORMAL LOW (ref 0.61–1.24)
GFR calc Af Amer: >60 mL/min (ref >60)
GFR calc non Af Amer: >60 mL/min (ref >60)
Glucose, Bld: 88 mg/dL (ref 70–99)
Potassium: 3 mmol/L — ABNORMAL LOW (ref 3.5–5.1)
Sodium: 138 mmol/L (ref 135–145)
Total Bilirubin: 0.4 mg/dL (ref 0.3–1.2)
Total Protein: 7.6 g/dL (ref 6.5–8.1)

## 2018-09-09 LAB — MAGNESIUM: Magnesium: 1.7 mg/dL (ref 1.7–2.4)

## 2018-09-09 MED ORDER — GERHARDT'S BUTT CREAM
TOPICAL_CREAM | Freq: Two times a day (BID) | CUTANEOUS | Status: DC | PRN
  Filled 2018-09-09: qty 1

## 2018-09-09 MED ORDER — POTASSIUM CHLORIDE CRYS ER 20 MEQ PO TBCR
20.0000 meq | EXTENDED_RELEASE_TABLET | Freq: Two times a day (BID) | ORAL | Status: DC
  Administered 2018-09-09 – 2018-09-11 (×5): 20 meq via ORAL
  Filled 2018-09-09 (×5): qty 1

## 2018-09-09 MED ORDER — MAGNESIUM GLUCONATE 500 MG PO TABS
500.0000 mg | ORAL_TABLET | Freq: Every day | ORAL | Status: DC
  Administered 2018-09-09 – 2018-09-18 (×10): 500 mg via ORAL
  Filled 2018-09-09 (×10): qty 1

## 2018-09-09 MED ORDER — ENSURE ENLIVE PO LIQD
237.0000 mL | Freq: Three times a day (TID) | ORAL | Status: DC
  Administered 2018-09-09 – 2018-09-17 (×25): 237 mL via ORAL
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
## 2018-09-09 NOTE — Progress Notes (Addendum)
Physical Therapy Note  Patient Details  Name: Ashon Rosenberg MRN: [id number] Date of Birth: 1966-01-09 Today's Date: 09/09/2018    Contacted pt's wife Tye [place]) via telephone & discussed possiblities of pt discharing home in w/c vs RW with [facility] ensuring w/c will fit in all necessary rooms of the house. Educated her on ELOS & pt's performance in therapy on this date, as well as weekly team meetings to decide more concrete d/c date next week. All questions answered to her satisfaction.  Addendum: Educated her that per PA pt is still NWB in LLE with cam boot.    [name] 09/09/2018, 4:27 PM
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
## 2018-09-09 NOTE — Progress Notes (Signed)
Inpatient Rehabilitation  Patient information reviewed and entered into eRehab system by Corbyn Wildey M. Lilyth Lawyer, M.A., CCC/SLP, PPS Coordinator.  Information including medical coding, functional ability and quality indicators will be reviewed and updated through discharge.    
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
## 2018-09-10 ENCOUNTER — Inpatient Hospital Stay (HOSPITAL_COMMUNITY): Payer: BC Managed Care – PPO

## 2018-09-10 ENCOUNTER — Inpatient Hospital Stay (HOSPITAL_COMMUNITY): Payer: BC Managed Care – PPO | Admitting: Speech Pathology

## 2018-09-10 DIAGNOSIS — H189 Unspecified disorder of cornea: Secondary | ICD-10-CM

## 2018-09-10 MED ORDER — MENINGOCOCCAL A C Y&W-135 OLIG IM SOLR: 0.5000 mL | Freq: Once | INTRAMUSCULAR | Status: DC

## 2018-09-10 MED ORDER — MEGESTROL ACETATE 400 MG/10ML PO SUSP
400.0000 mg | Freq: Two times a day (BID) | ORAL | Status: DC
  Administered 2018-09-10 – 2018-09-15 (×11): 400 mg via ORAL
  Filled 2018-09-10 (×10): qty 10

## 2018-09-10 MED ORDER — MENINGOCOCCAL VAC B (OMV) IM SUSY: 0.5000 mL | PREFILLED_SYRINGE | Freq: Once | INTRAMUSCULAR | Status: DC

## 2018-09-10 MED ORDER — NAPHAZOLINE-GLYCERIN 0.012-0.2 % OP SOLN
1.0000 [drp] | Freq: Four times a day (QID) | OPHTHALMIC | Status: DC | PRN
  Administered 2018-09-17: 2 [drp] via OPHTHALMIC
  Filled 2018-09-10: qty 15

## 2018-09-10 MED ORDER — PNEUMOCOCCAL VAC POLYVALENT 25 MCG/0.5ML IJ INJ: 0.5000 mL | INJECTION | Freq: Once | INTRAMUSCULAR | Status: DC
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
## 2018-09-10 NOTE — Progress Notes (Signed)
Occupational Therapy Session Note  Patient Details  Name: Michael Wilkinson MRN: [id number] Date of Birth: [birth year]  Today's Date: 09/10/2018 OT Individual Time: 1300-1413 OT Individual Time Calculation (min): 73 min    Short Term Goals: Week 1:  OT Short Term Goal 1 (Week 1): Pt will complete sit > stand with max +1 assist OT Short Term Goal 2 (Week 1): Pt will don shorts with mod A OT Short Term Goal 3 (Week 1): Pt will tolerate OOB for 2 hours to increase functional activity tolerance OT Short Term Goal 4 (Week 1): Pt will transfer to [facility] with max +1  Skilled Therapeutic Interventions/Progress Updates:    1:1. Pt received in bed. Pt reporting difficulty with CAM boot d/t vision. OT modifies CAM straps with bright colored coban to contrast. Pt completes LB dressing with lateral leans/rolling to doff and don pants with A to thread LLE. Discussed reacher use to thread LLE independently. Pt able to don shirt with set up. Pt completes donning top half of cam boot with min VC reporting color makes it easier to distinguish straps but reporting pain when bending forward so OT completes bottom 2 straps. Pt completes lateral scoot transfer with MIN-mod A overall EOB<>w/c. OT retrieves [facility] wide to improve safety and independence iwht toileing with nursing staff. Exited session with pt seated in bed, exit alarm on and call light inreach  Therapy Documentation Precautions:  Precautions Precautions: Fall Required Braces or Orthoses: Other Brace Other Brace: CAM boot RLE Restrictions Weight Bearing Restrictions: Yes LLE Weight Bearing: Non weight bearing General:   Vital Signs: Therapy Vitals Temp: 98.6 F (37 C) Temp Source: Oral Pulse Rate: (!) 109 Resp: 20 BP: (!) 124/96 Patient Position (if appropriate): Sitting Oxygen Therapy SpO2: 96 % O2 Device: Room Air Pain: Pain Assessment Pain Score: 4  ADL:   Vision   Perception    Praxis   Exercises:   Other Treatments:      Therapy/Group: Individual Therapy  Tonny Branch 09/10/2018, 2:14 PM

## 2018-09-10 NOTE — Progress Notes (Signed)
Calorie Count Note: Day 1  48-hour calorie count ordered.  Diet: Dysphagia 2 with thin liquids Supplements: Ensure Enlive TID  7/29 Lunch: 145 kcal, 5 grams of protein 7/29 Dinner: 250 kcal, 13 grams of protein 7/30 Breakfast: 170 kcal, 3 grams of protein Supplements: 700 kcal, 40 grams of protein (2 Ensure Enlive oral nutrition supplements)  Total 24-hour intake: 1265 kcal (55% of minimum estimated needs)  61 grams of protein (55% of minimum estimated needs)  Nutrition Diagnosis: Moderate Malnutrition related to chronic illness (TBI with SDH/IVH/SAH) as evidenced by mild fat depletion, moderate fat depletion, mild muscle depletion, moderate muscle depletion.  Goal: Patient will meet greater than or equal to 90% of their needs  Intervention: - Continue 48-hour calorie count - Ensure Enlive po TID, each supplement provides 350 kcal and 20 grams of protein   Gaynell Face, MS, RD, LDN Inpatient Clinical Dietitian Pager: [phone] Weekend/After Hours: [phone]

## 2018-09-10 NOTE — Progress Notes (Signed)
Physical Therapy Session Note  Patient Details  Name: Michael Wilkinson MRN: [id number] Date of Birth: Feb 21, 1965  Today's Date: 09/10/2018 PT Individual Time: 0930-1015 PT Individual Time Calculation (min): 45 min   Short Term Goals: Week 1:  PT Short Term Goal 1 (Week 1): Pt will consistently complete bed<>w/c transfers with supervision. PT Short Term Goal 2 (Week 1): Pt will transfer sit<>stand with mod assist +1 & LRAD. PT Short Term Goal 3 (Week 1): Pt will propel w/c 75 ft with supervision. PT Short Term Goal 4 (Week 1): Pt will ambulate 10 ft with LRAD & mod assist +1.  Skilled Therapeutic Interventions/Progress Updates:     Patient in bed upon PT arrival. Patient alert and agreeable to PT session. He stated that he had been down for an x-ray for his L wrist this morning. Patient reported 3-4/10 pain in his L LE and wrist this morning. He declined calling for pain medicine. PT provided rest breaks, repositioning, and distraction throughout session as pain interventions.   Therapeutic Activity: Bed Mobility: Patient performed supine to/from sit with CGA in a flat bed with minimal use of bed rails. Provided verbal cues for sliding feet off before sitting up. Sitting EOB, he donned shorts with max A for threading LEs and mod A for sliding shorts up over each hip while performing lateral leans to each elbow and supervision for donning a shirt. PT donned CAM boot with total A for time management. Patient stated that he cannot see well and was unable to watch PT demonstrate donning CAM boot.  Transfers: Patient Attempted sit to/form stand x1 using a RW with mod-max A, terminated transfer due to the patient being unable to maintain NWB on L LE despite heavy cues from therapist and he reported pain in L wrist when lifting his bottom off the bed. Patient performed lateral scoot transfer to/from the w/c with CGA for safety. Provided cues for NWB on L LE, placed foot under patients CAM boot x1 asking  patient to not apply pressure to PTs foot with good success. Patient stated that the PT was in the way doing this and did not agree to allowing PT to place her foot under his on second trial with poorer performance of maintaining NWB status.   Patient declined leaving the room today due to sensitivity to lights in the hallway and gym.   Therapeutic Exercise: Patient performed the following exercises with verbal and tactile cues for proper technique. Seated: -B hip flexion 2x10 -B LAQ 2x10 AAROM on L -R toe raised 2x10 After perfoming seated exercises, patient reported feeling dizzy and sweaty and requested to get back to bed. He was assisted back to bed as above, BP in supine 135/84 HR 84. RN made aware of symptoms and vitals after session. Provided patient a lying rest break and he reported all symptoms had resolved and agreed to continuing with bed level exercises.  Lying in supine: -B heel slides x10 AAROM on L -B SLR x10 AAROM -B hip abduction x10 AAROM on L  Patient in bed at end of session with breaks locked, bed alarm set, and all needs within reach. Patient requested pain medicine at end of session, RN made aware.    Therapy Documentation Precautions:  Precautions Precautions: Fall Required Braces or Orthoses: Other Brace Other Brace: CAM boot RLE Restrictions Weight Bearing Restrictions: Yes LLE Weight Bearing: Non weight bearing    Therapy/Group: Individual Therapy  Ryli Standlee L Tomeca Helm PT, DPT  09/10/2018, 12:39 PM

## 2018-09-11 ENCOUNTER — Inpatient Hospital Stay (HOSPITAL_COMMUNITY): Payer: BC Managed Care – PPO | Admitting: Physical Therapy

## 2018-09-11 ENCOUNTER — Inpatient Hospital Stay (HOSPITAL_COMMUNITY): Payer: BC Managed Care – PPO | Admitting: Speech Pathology

## 2018-09-11 ENCOUNTER — Inpatient Hospital Stay (HOSPITAL_COMMUNITY): Payer: BC Managed Care – PPO | Admitting: Occupational Therapy

## 2018-09-11 DIAGNOSIS — S82202S Unspecified fracture of shaft of left tibia, sequela: Secondary | ICD-10-CM

## 2018-09-11 LAB — BASIC METABOLIC PANEL
Anion gap: 11 (ref 5–15)
BUN: 5 mg/dL — ABNORMAL LOW (ref 6–20)
CO2: 24 mmol/L (ref 22–32)
Calcium: 9.6 mg/dL (ref 8.9–10.3)
Chloride: 104 mmol/L (ref 98–111)
Creatinine, Ser: 0.5 mg/dL — ABNORMAL LOW (ref 0.61–1.24)
GFR calc Af Amer: >60 mL/min (ref >60)
GFR calc non Af Amer: >60 mL/min (ref >60)
Glucose, Bld: 99 mg/dL (ref 70–99)
Potassium: 3.4 mmol/L — ABNORMAL LOW (ref 3.5–5.1)
Sodium: 139 mmol/L (ref 135–145)

## 2018-09-11 MED ORDER — POTASSIUM CHLORIDE CRYS ER 20 MEQ PO TBCR
20.0000 meq | EXTENDED_RELEASE_TABLET | Freq: Two times a day (BID) | ORAL | Status: DC
  Administered 2018-09-11 – 2018-09-14 (×6): 20 meq via ORAL
  Filled 2018-09-11 (×6): qty 1

## 2018-09-11 MED ORDER — POTASSIUM CHLORIDE CRYS ER 20 MEQ PO TBCR: 40.0000 meq | EXTENDED_RELEASE_TABLET | Freq: Two times a day (BID) | ORAL | Status: DC
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
## 2018-09-11 NOTE — Progress Notes (Signed)
Calorie Count Note: Day 2  48-hour calorie count ordered.  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  Goal: Patient will meet greater than or equal to 90% of their needs  Intervention: - d/c 48-hour calorie count - Continue Ensure Enlive po TID, each supplement provides 350 kcal and 20 grams of protein - Add Magic cup TID with meals, each supplement provides 290 kcal and 9 grams of protein   Gaynell Face, MS, RD, LDN Inpatient Clinical Dietitian Pager: [phone] Weekend/After Hours: [phone]
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
## 2018-09-11 NOTE — Progress Notes (Signed)
Physical Therapy Note  Patient Details  Name: Michael Wilkinson MRN: [id number] Date of Birth: 04/01/1965 Today's Date: 09/11/2018    Pt received in bed appearing to feel unwell & pt reporting nausea. RN made aware & administered anti nausea meds & therapist allowed meds to take effect. Therapist returned later & pt still appearing & reporting feeling unwell. Therapist assisted pt with doffing shirt per pt request & encouraged pt to eat/drink, offered pt ginger ale but he declined. Pt left in bed with all needs at hand. Pt missed 75 minutes skilled PT treatment, will f/u per POC.   [name] 09/11/2018, 1:32 PM

## 2018-09-11 NOTE — IPOC Note (Signed)
Overall Plan of Care [facility]) Patient Details Name: Michael Wilkinson MRN: [id number] DOB: 09-29-1965  Admitting Diagnosis: TBI (traumatic brain injury) [facility])  Hospital Problems: Principal Problem:   TBI (traumatic brain injury) ([place]) Active Problems:   Tibia/fibula fracture, left, sequela     Functional Problem List: Nursing Bladder, Bowel, Skin Integrity, Edema, Nutrition  PT Balance, Behavior, Safety, Sensory, Endurance, Skin Integrity, Motor, Pain, Nutrition  OT Balance, Pain, Cognition, Safety, Sensory, Motor, Endurance, Skin Integrity  SLP Cognition  TR         Basic ADL's: OT Grooming, Bathing, Dressing, Toileting     Advanced  ADL's: OT       Transfers: PT Bed Mobility, Bed to Chair, Car, Manufacturing systems engineer, Metallurgist: PT Ambulation, Emergency planning/management officer     Additional Impairments: OT None  SLP Social Cognition   Problem Solving, Memory, Awareness  TR      Anticipated Outcomes Item Anticipated Outcome  Self Feeding no goal set  Swallowing      Basic self-care  (S)  Toileting  (S)   Bathroom Transfers (S)  Bowel/Bladder  Patient will be continent of bowel and bladder  Transfers  mod I transfers to w/c  Locomotion  mod I w/c level, min assist short distance gait  Communication     Cognition  Supervision  Pain  Patient pain level will less than 3 of 10  Safety/Judgment  Patient will be compliant with safety measures   Therapy Plan: PT Intensity: Minimum of 1-2 x/day ,45 to 90 minutes PT Frequency: 5 out of 7 days PT Duration Estimated Length of Stay: 1.5 - 2 weeks OT Intensity: Minimum of 1-2 x/day, 45 to 90 minutes OT Frequency: 5 out of 7 days OT Duration/Estimated Length of Stay: 14-18 days SLP Intensity: Minumum of 1-2 x/day, 30 to 90 minutes SLP Frequency: 3 to 5 out of 7 days SLP Duration/Estimated Length of Stay: 2 weeks   Due to the current state of emergency, patients may not be receiving their 3-hours of  Medicare-mandated therapy.   Team Interventions: Nursing Interventions Dysphagia/Aspiration Precaution Training, Bladder Management, Bowel Management, Disease Management/Prevention, Patient/Family Education, Discharge Planning  PT interventions Ambulation/gait training, Community reintegration, DME/adaptive equipment instruction, Neuromuscular re-education, Psychosocial support, Stair training, UE/LE Strength taining/ROM, Wheelchair propulsion/positioning, UE/LE Coordination activities, Therapeutic Activities, Skin care/wound management, Pain management, Discharge planning, Training and development officer, Cognitive remediation/compensation, Disease management/prevention, Functional mobility training, Patient/family education, Splinting/orthotics, Therapeutic Exercise, Visual/perceptual remediation/compensation  OT Interventions Balance/vestibular training, Discharge planning, Pain management, Self Care/advanced ADL retraining, Therapeutic Activities, UE/LE Coordination activities, Visual/perceptual remediation/compensation, Therapeutic Exercise, Skin care/wound managment, Patient/family education, Functional mobility training, Disease mangement/prevention, Cognitive remediation/compensation, Wheelchair propulsion/positioning, UE/LE Strength taining/ROM, Splinting/orthotics, Psychosocial support, Neuromuscular re-education, DME/adaptive equipment instruction, Community reintegration  SLP Interventions Cognitive remediation/compensation, Internal/external aids, Therapeutic Activities, Environmental controls, English as a second language teacher, Functional tasks, Patient/family education  TR Interventions    SW/CM Interventions Discharge Planning, Psychosocial Support, Patient/Family Education   Barriers to Discharge MD  Medical stability  Nursing      PT Home environment access/layout unsure if w/c can fit in all rooms of the house  OT      SLP      SW       Team Discharge Planning: Destination: PT-Home ,OT- Home ,  SLP-Home Projected Follow-up: PT-Home health PT, 24 hour supervision/assistance, OT-  Home health OT, SLP-None Projected Equipment Needs: PT-Wheelchair (measurements), Rolling walker with 5" wheels, OT- To be determined, SLP-None recommended by SLP Equipment Details: PT-18x18 w/c  with ELR, RW, OT-  Patient/family involved in discharge planning: PT- Patient,  OT-Patient, SLP-Patient  MD ELOS: 15-18 days Medical Rehab Prognosis:  Excellent Assessment: The patient has been admitted for CIR therapies with the diagnosis of TBI and polytrauma. The team will be addressing functional mobility, strength, stamina, balance, safety, adaptive techniques and equipment, self-care, bowel and bladder mgt, patient and caregiver education, NMR, ortho precautions, pain mgt, wound care. Goals have been set at Supervision for self-care and cognition and mod I for basic mobility at w/c level.   Due to the current state of emergency, patients may not be receiving their 3 hours per day of Medicare-mandated therapy.    Ranelle OysterZachary T. Swartz, MD, FAAPMR      See Team Conference Notes for weekly updates to the plan of care

## 2018-09-12 ENCOUNTER — Inpatient Hospital Stay (HOSPITAL_COMMUNITY): Payer: BC Managed Care – PPO

## 2018-09-12 ENCOUNTER — Inpatient Hospital Stay (HOSPITAL_COMMUNITY): Payer: BC Managed Care – PPO | Admitting: Speech Pathology

## 2018-09-12 ENCOUNTER — Inpatient Hospital Stay (HOSPITAL_COMMUNITY): Payer: BC Managed Care – PPO | Admitting: Physical Therapy

## 2018-09-12 DIAGNOSIS — D62 Acute posthemorrhagic anemia: Secondary | ICD-10-CM

## 2018-09-12 DIAGNOSIS — T148XXA Other injury of unspecified body region, initial encounter: Secondary | ICD-10-CM

## 2018-09-12 DIAGNOSIS — R1310 Dysphagia, unspecified: Secondary | ICD-10-CM

## 2018-09-12 DIAGNOSIS — R52 Pain, unspecified: Secondary | ICD-10-CM
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
## 2018-09-12 NOTE — Progress Notes (Signed)
Occupational Therapy Session Note  Patient Details  Name: Michael Wilkinson MRN: [id number] Date of Birth: 1965-05-28  Today's Date: 09/12/2018 OT Individual Time: 1423-9532 OT Individual Time Calculation (min): 45 min    Short Term Goals: Week 1:  OT Short Term Goal 1 (Week 1): Pt will complete sit > stand with max +1 assist OT Short Term Goal 2 (Week 1): Pt will don shorts with mod A OT Short Term Goal 3 (Week 1): Pt will tolerate OOB for 2 hours to increase functional activity tolerance OT Short Term Goal 4 (Week 1): Pt will transfer to [facility] with max +1  Skilled Therapeutic Interventions/Progress Updates:    Pt received supine with c/o pain in L leg and L wrist, RN providing pain medicine at the end of the session. Pt completed peri hygiene at bed level, rolling R and L with (S). Mod A to don incontinence brief. Pt able to don shorts at bed level with min A, requiring assistance to thread through LLE. Pt completed bed mobility to EOB with CGA. Pt maintained EOB sitting balance for 15 min with no LOB, reaching distally and outside of BOS. Pt donned R sock with (S), requiring min A to don L cam boot. Pt donned shirt (S). Pt able to don L SPICA splint with (S), cueing for technique. Pt completed squat pivot transfer to w/c with min A. Pt was taken to therapy gym. Pt held onto parallel bar and the beep board was used under his LLE to demonstrate his poor ability to adhere to NWB status. Pt was unable to offload LLE during sit > stand but once standing was able to maintain. Edu on importance of weight bearing precautions, pt indicated understanding. Pt returned to room and edu on importance on increasing PO intake. Pt left sitting up with all needs met, chair alarm belt set.   Therapy Documentation Precautions:  Precautions Precautions: Fall Required Braces or Orthoses: Other Brace Other Brace: CAM boot RLE Restrictions Weight Bearing Restrictions: Yes LLE Weight Bearing: Non weight  bearing   Therapy/Group: Individual Therapy  Curtis Sites 09/12/2018, 6:41 AM

## 2018-09-12 NOTE — Progress Notes (Addendum)
Physical Therapy Session Note  Patient Details  Name: Michael Wilkinson MRN: [id number] Date of Birth: 09/11/1965  Today's Date: 09/12/2018 PT Individual Time: [id number] PT Individual Time Calculation (min): 48 min   Short Term Goals: Week 1:  PT Short Term Goal 1 (Week 1): Pt will consistently complete bed<>w/c transfers with supervision. PT Short Term Goal 2 (Week 1): Pt will transfer sit<>stand with mod assist +1 & LRAD. PT Short Term Goal 3 (Week 1): Pt will propel w/c 75 ft with supervision. PT Short Term Goal 4 (Week 1): Pt will ambulate 10 ft with LRAD & mod assist +1.  Skilled Therapeutic Interventions/Progress Updates:   Pt received supine in bed talking on the phone with family and pt agreeable to therapy session. Supine>sit, HOB partially elevated and using bedrails with CGA for steadying. Donned L LE cam boot and L Spica wrist splint with max assist for time management. R lateral scoot transfer EOB>w/c with multimodal cuing for extending L LE to ensure NWB with pt becoming irritated stating doing that causes him to lose his balance but with encouragement agreeable - min assist for scooting hips. Performed B UE w/c propulsion ~318ft prior to pt reporting his L fingers were hurting, encouraged pt to use R LE to assist with propulsion but pt not agreeable therefore dependent transfer remainder of distance to therapy gym. R lateral scoot transfer w/c>EOM with pt education on proper set-up and max cuing for sequencing of transfer and maintaining L LE NWB - min assist for pivoting hips. Seated EOM performed B LE long arch quads 2x15 on R LE and 2x8 reps on L LE with pt reporting "that's enough" when doing exercise with L LE. L lateral scoot transfer EOM>w/c with max cuing again for L LE NWB and min assist for scooting hips. Sit<>stand x1 in // bars with mod/max assist for lifting into standing and pt unable to maintain L LE NWB status despite cuing and placing into extension prior to initiating  standing - upon coming to standing pt reports he felt a "pop" in his R knee with pain - returned to sitting for rest break and encouraged pt to perform gentle active R knee flexion/extension with pt reporting some pain relief. Pt deferring any further standing or activity with R LE due to the pop.  With max encouragement pt performed the following UE exercises: - R UE 4lb weight bicep curls and overhead press 2x10 repetitions each - L UE, no weight, bicep curls 2x8 reps and overhead press 2x5 reps with pt stating "I can't" when encouraged to increase reps on L Encouraged pt again to perform w/c propulsion using R LE to assist but he deferred therefore used R UE and max assist for propulsion back to room. Pt refused to stay in w/c stating he was getting mad despite max encouragement and education on benefits of OOB sitting activity. L lateral scoot transfer w/c>EOB with min assist for scooting and despite max cuing and positioning L LE into extension pt unable to maintain NWB precautions. Sit>supine with supervision. Doffed L cam boot and spica splint. Pt left supine in bed with needs in reach, bed alarm on, and RN notified of pain and pt's position.  Therapy Documentation Precautions:  Precautions Precautions: Fall Required Braces or Orthoses: Other Brace Other Brace: CAM boot LLE Restrictions Weight Bearing Restrictions: Yes LLE Weight Bearing: Non weight bearing  Addendum: CAM boot precaution is for L LE  Pain: After performing sit<>stand in // bar reports headache pain  and R knee pain stating it "popped" when he was standing - provided rest and performed R knee active flexion/extension for pain management - RN notified of headache pain.   Therapy/Group: Individual Therapy  Tawana Scale, PT, DPT 09/12/2018, 2:55 PM

## 2018-09-13 ENCOUNTER — Inpatient Hospital Stay (HOSPITAL_COMMUNITY): Payer: BC Managed Care – PPO | Admitting: Physical Therapy

## 2018-09-13 ENCOUNTER — Inpatient Hospital Stay (HOSPITAL_COMMUNITY): Payer: BC Managed Care – PPO

## 2018-09-13 DIAGNOSIS — H189 Unspecified disorder of cornea: Secondary | ICD-10-CM

## 2018-09-13 MED ORDER — ERYTHROMYCIN 5 MG/GM OP OINT
TOPICAL_OINTMENT | OPHTHALMIC | Status: DC
  Administered 2018-09-13 (×3): via OPHTHALMIC
  Filled 2018-09-13: qty 3.5

## 2018-09-13 MED ORDER — ERYTHROMYCIN 5 MG/GM OP OINT
TOPICAL_OINTMENT | OPHTHALMIC | Status: DC
  Administered 2018-09-13 – 2018-09-14 (×5): via OPHTHALMIC
  Filled 2018-09-13: qty 3.5

## 2018-09-13 NOTE — Progress Notes (Signed)
[facility] Individual Statement of Services  Patient Name:  Michael Wilkinson  Date:  09/13/2018  Welcome to the [facility].  Our goal is to provide you with an individualized program based on your diagnosis and situation, designed to meet your specific needs.  With this comprehensive rehabilitation program, you will be expected to participate in at least 3 hours of rehabilitation therapies Monday-Friday, with modified therapy programming on the weekends.  Your rehabilitation program will include the following services:  Physical Therapy (PT), Occupational Therapy (OT), Speech Therapy (ST), 24 hour per day rehabilitation nursing, Neuropsychology, Case Management (Social Worker), Rehabilitation Medicine, Nutrition Services and Pharmacy Services  Weekly team conferences will be held on Tuesdays to discuss your progress.  Your Social Worker will talk with you frequently to get your input and to update you on team discussions.  Team conferences with you and your family in attendance may also be held.  Expected length of stay:  1 1/2 to 2 1/2 weeks  Overall anticipated outcome:  Supervision and minimal assistance   Depending on your progress and recovery, your program may change. Your Social Worker will coordinate services and will keep you informed of any changes. Your Social Worker's name and contact numbers are listed  below.  The following services may also be recommended but are not provided by the [facility] will be made to provide these services after discharge if needed.  Arrangements include referral to agencies that provide these services.  Your insurance has been verified to be:  [employer] Your primary doctor is:  Dr. Lorenso Courier  Pertinent information will be  shared with your doctor and your insurance company.  Social Worker:  Alfonse Alpers, LCSW  [phone] or [phone]  Information discussed with and copy given to patient by: Trey Sailors, 09/13/2018, 12:42 AM

## 2018-09-13 NOTE — Progress Notes (Signed)
Occupational Therapy Session Note  Patient Details  Name: Michael Wilkinson MRN: [id number] Date of Birth: 01/22/1966  Today's Date: 09/13/2018 OT Individual Time: 0815-0900 OT Individual Time Calculation (min): 45 min    Short Term Goals: Week 1:  OT Short Term Goal 1 (Week 1): Pt will complete sit > stand with max +1 assist OT Short Term Goal 2 (Week 1): Pt will don shorts with mod A OT Short Term Goal 3 (Week 1): Pt will tolerate OOB for 2 hours to increase functional activity tolerance OT Short Term Goal 4 (Week 1): Pt will transfer to [facility] with max +1  Skilled Therapeutic Interventions/Progress Updates:    Pt received supine c/o soreness all over, no request for pain intervention. Discussed home set up and potential use of AE for toileting/shower. Pt requested we call wife to include her in conversation. [name] was part of conversation as we discussed bariatric BSC vs regular one and use of a seat in the shower. Also reviewed pt's CLOF and goals. Wife supportive and willing to do whatever is needed to help. Pt completed bed mobility to EOB with close (S). Pt sat EOB with no assist, no LOB. Pt completed UB bathing with set up assist. Pt threaded BLE through shorts (requiring cueing for encouragement) with no physical assist. Pt returned to supine and pulled up pants with no physical assist. With MAX encouragement pt returned to EOB and completed squat pivot transfer to w/c with CGA. Pt was left sitting up with all needs met, chair alarm set. Goal of 30 min sitting up verbalized to pt.   Therapy Documentation Precautions:  Precautions Precautions: Fall Required Braces or Orthoses: Other Brace Other Brace: CAM boot LLE, Wrist splint L UE Restrictions Weight Bearing Restrictions: Yes LLE Weight Bearing: Non weight bearing   Therapy/Group: Individual Therapy  Curtis Sites 09/13/2018, 7:07 AM

## 2018-09-13 NOTE — Progress Notes (Signed)
Physical Therapy Session Note  Patient Details  Name: Michael Wilkinson MRN: [id number] Date of Birth: [birth year]  Today's Date: 09/13/2018 PT Individual Time: 1305-1350 PT Individual Time Calculation (min): 45 min   Short Term Goals: Week 1:  PT Short Term Goal 1 (Week 1): Pt will consistently complete bed<>w/c transfers with supervision. PT Short Term Goal 2 (Week 1): Pt will transfer sit<>stand with mod assist +1 & LRAD. PT Short Term Goal 3 (Week 1): Pt will propel w/c 75 ft with supervision. PT Short Term Goal 4 (Week 1): Pt will ambulate 10 ft with LRAD & mod assist +1.  Skilled Therapeutic Interventions/Progress Updates:   Pt received in bed, reporting headache due to visual impairments but willing to participate in therapy.  RN notified of pain.  Assisted pt with donning CAM boot; pt refused to don shoe on R foot.  Performed transfers to EOB with min A and lateral scooting to chair with min A with continued poor compliance with NWB LLE.  Pt refused to perform w/c mobility stating he couldn't push.  Total A w/c mobility to gym.  Due to significant pressure through L wrist and pain performed multiple sit > stand from w/c with UE support on EVA walker.  First stand pt able to stand x 1-2 minutes without c/o pain in L wrist and without R knee popping.  Returned to sitting due to fatigue.  Returned to standing and performed R SLS with 3 reps LLE high knee marching.  Returned to w/c and to room due to HA and fatigue.  Pt refused to sit up in chair due to HA pain stating, "I've sat up twice today."  Discussed with pt importance of performing a little more each day: one more stand, one small hop forwards, sitting up 30 minute extra each day to make progress towards D/C and to improve strength and functional endurance.  Performed transfer back to bed and to supine with min A.  Pt left in bed with alarm set and all items within reach.    Therapy Documentation Precautions:  Precautions Precautions:  Fall Required Braces or Orthoses: Other Brace Other Brace: CAM boot LLE, Wrist splint L UE Restrictions Weight Bearing Restrictions: Yes LLE Weight Bearing: Non weight bearing Vital Signs: Therapy Vitals Temp: 98.8 F (37.1 C) Temp Source: Oral Pulse Rate: 96 Resp: 19 BP: 128/89 Patient Position (if appropriate): Lying Oxygen Therapy SpO2: 100 % O2 Device: Room Air Pain: Pain Assessment Pain Scale: 0-10 Pain Score: 5  Pain Location: Head Pain Descriptors / Indicators: Headache Pain Onset: On-going Pain Intervention(s): RN made aware Mobility: Bed Mobility Bed Mobility: Rolling Left;Supine to Sit;Sit to Supine Rolling Left: Minimal Assistance - Patient > 75% Supine to Sit: Minimal Assistance - Patient > 75% Sit to Supine: Minimal Assistance - Patient > 75% Transfers Transfers: Set designer Transfers;Sit to Stand;Stand to Sit Sit to Stand: Maximal Assistance - Patient 25-49% Stand to Sit: Moderate Assistance - Patient 50-74% Squat Pivot Transfers: Minimal Assistance - Patient > 75% Exercises: General Exercises - Lower Extremity Hip Flexion/Marching: Strengthening;Left;Other reps (comment);Standing(3 reps) Repetitive Sit to Stands: Two upper extremities;Other (comment)(2 reps with EVA walker) Repetitive Sit to Stands Level of Assist: Mod;Max;Min(max to stand, mod to return to sitting, min A once up) Repetitive Sit to Stands Surface Height: 18 inches   Therapy/Group: Individual Therapy Rico Junker, PT, DPT 09/13/18    4:37 PM    09/13/2018, 4:36 PM
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
## 2018-09-14 ENCOUNTER — Inpatient Hospital Stay (HOSPITAL_COMMUNITY): Payer: BC Managed Care – PPO | Admitting: Physical Therapy

## 2018-09-14 ENCOUNTER — Inpatient Hospital Stay (HOSPITAL_COMMUNITY): Payer: BC Managed Care – PPO

## 2018-09-14 ENCOUNTER — Inpatient Hospital Stay (HOSPITAL_COMMUNITY): Payer: BC Managed Care – PPO | Admitting: Speech Pathology

## 2018-09-14 LAB — CBC
HCT: 41.4 % (ref 39.0–52.0)
Hemoglobin: 12.9 g/dL — ABNORMAL LOW (ref 13.0–17.0)
MCH: 27.6 pg (ref 26.0–34.0)
MCHC: 31.2 g/dL (ref 30.0–36.0)
MCV: 88.7 fL (ref 80.0–100.0)
Platelets: 552 10*3/uL — ABNORMAL HIGH (ref 150–400)
RBC: 4.67 MIL/uL (ref 4.22–5.81)
RDW: 16.3 % — ABNORMAL HIGH (ref 11.5–15.5)
WBC: 11.8 10*3/uL — ABNORMAL HIGH (ref 4.0–10.5)
nRBC: 0 % (ref 0.0–0.2)

## 2018-09-14 LAB — BASIC METABOLIC PANEL
Anion gap: 9 (ref 5–15)
BUN: 5 mg/dL — ABNORMAL LOW (ref 6–20)
CO2: 21 mmol/L — ABNORMAL LOW (ref 22–32)
Calcium: 9.9 mg/dL (ref 8.9–10.3)
Chloride: 108 mmol/L (ref 98–111)
Creatinine, Ser: 0.5 mg/dL — ABNORMAL LOW (ref 0.61–1.24)
GFR calc Af Amer: >60 mL/min (ref >60)
GFR calc non Af Amer: >60 mL/min (ref >60)
Glucose, Bld: 90 mg/dL (ref 70–99)
Potassium: 4.1 mmol/L (ref 3.5–5.1)
Sodium: 138 mmol/L (ref 135–145)

## 2018-09-14 MED ORDER — ERYTHROMYCIN 5 MG/GM OP OINT
TOPICAL_OINTMENT | OPHTHALMIC | Status: DC
  Administered 2018-09-14 – 2018-09-18 (×33): via OPHTHALMIC

## 2018-09-14 MED ORDER — POTASSIUM CHLORIDE CRYS ER 20 MEQ PO TBCR
20.0000 meq | EXTENDED_RELEASE_TABLET | Freq: Every day | ORAL | Status: DC
  Administered 2018-09-15 – 2018-09-18 (×4): 20 meq via ORAL
  Filled 2018-09-14 (×4): qty 1

## 2018-09-14 NOTE — Progress Notes (Signed)
Consult to remove midline catheter. Called pt's nurse, informed her she can remove midline. Midline is a longer peripheral IV. Removal is treated like PIV removal.

## 2018-09-14 NOTE — Progress Notes (Signed)
Occupational Therapy Session Note  Patient Details  Name: Michael Wilkinson MRN: [id number] Date of Birth: September 05, 1965  Today's Date: 09/14/2018 OT Individual Time: 1130-1200 OT Individual Time Calculation (min): 30 min   Session 2:  OT Individual Time: 1500-1600  OT Individual Time Calculation (min): 60 min   Short Term Goals: Week 1:  OT Short Term Goal 1 (Week 1): Pt will complete sit > stand with max +1 assist OT Short Term Goal 2 (Week 1): Pt will don shorts with mod A OT Short Term Goal 3 (Week 1): Pt will tolerate OOB for 2 hours to increase functional activity tolerance OT Short Term Goal 4 (Week 1): Pt will transfer to [facility] with max +1  Skilled Therapeutic Interventions/Progress Updates:    Pt received supine, less encouragement than usual for pt to get out of bed. Pt completed bed mobility to EOB with (S). Pt completed lateral scoot/squat pivot combo with close (S). Pt was transported to therapy gym where he transferred to mat in similar fashion but then reported urgent Bm. Pt was transported back to room. He completed sit > stand with min A with use of grab bar. Assist required for clothing management. Min A for stand pivot to bariatric BSC. Pt voided bowel/bladder. Pt able to complete peri hygiene with (S) while seated, cueing for attempting lateral leans and for scooting forward on BSC to increase access to bottom. Pt required assistance for clothing management in standing and then pivoted to w/c. Pt returned to bed and was left with all needs met, bed alarm set.   Session 2: Pt received supine with no c/o pain other than usual soreness. Air leaking from stoma heavily this session. Pt transitioned to EOB and completed UB bathing, declining LB. Gauze pad placed over abrasion on pt's L foot to ensure protection from cam boot. Pt completed squat pivot transfer with CGA to w/c. Assisted pt in shaving, max A 2/2 visual deficits and need for accuracy around stoma. Hopeful this will increase  efficiency of occlusive dressing to prevent more air leakage when pt forgets to manually occlude.  Pt was then taken down to the therapy gym total A. Using the eva walker, pt stood x2 trials with mod A. Pt required cueing for body mechanics, upright posture, and pelvic rotation. Attempted to use some co-band as another occlusive over gauze pad/tape on stoma, but this was ineffective. Provided further edu on importance of occluding stoma dressing every time pt speaks. Pt returned to his room and was encouraged to remain sitting up for 30 min. Chair alarm belt fastened.    Therapy Documentation Precautions:  Precautions Precautions: Fall Required Braces or Orthoses: Other Brace Other Brace: CAM boot LLE, Wrist splint L UE Restrictions Weight Bearing Restrictions: Yes LLE Weight Bearing: Non weight bearing   Therapy/Group: Individual Therapy  Curtis Sites 09/14/2018, 6:51 AM
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
## 2018-09-14 NOTE — Progress Notes (Addendum)
Physical Therapy Session Note  Patient Details  Name: Michael Wilkinson MRN: [id number] Date of Birth: [birth year]  Today's Date: 09/14/2018 PT Individual Time: 0910-0950 PT Individual Time Calculation (min): 40 min   Short Term Goals: Week 1:  PT Short Term Goal 1 (Week 1): Pt will consistently complete bed<>w/c transfers with supervision. PT Short Term Goal 2 (Week 1): Pt will transfer sit<>stand with mod assist +1 & LRAD. PT Short Term Goal 3 (Week 1): Pt will propel w/c 75 ft with supervision. PT Short Term Goal 4 (Week 1): Pt will ambulate 10 ft with LRAD & mod assist +1.  Skilled Therapeutic Interventions/Progress Updates:  Pt received in bed, reluctantly agreeable to tx. Pt appearing down and reporting "they should've just let me die" and talked about giving up - PA, social worker & team made aware & therapist provided encouragement. Discussed the fact that pt would like to go home this week & educated him on goals and tasks he needs to be able to do to go home. Pt requests therapist step out while he uses urinal (+ void) and pt dons LUE splint without assistance. Pt reports he has no clothing donned & provided pt with brief but once covers moved pt actually with brief donned. Pt dons shorts from bed level with assistance for time management and dons shirt with set up assist. Provided assistance for donning L CAM boot for time management, and pt dons R sock & shoe with assistance to tie laces. Pt transfers bed>w/c (equal height surfaces) via lateral scoot with cuing for technique, CGA overall, & therapist placing LLE on therapist's foot to ensure pt maintains NWB (much to pt's dismay). Pt propels w/c room<>nurses station with BUE & supervision with frequent rest breaks & encouragement to take breaks then continue as needed. At end of session pt left in w/c with chair alarm donned & all needs in reach.   Pain: no c/o pain reported.  Therapy Documentation Precautions:  Precautions Precautions:  Fall Required Braces or Orthoses: Other Brace Other Brace: CAM boot LLE, Wrist splint L UE Restrictions Weight Bearing Restrictions: Yes LLE Weight Bearing: Non weight bearing    Therapy/Group: Individual Therapy  [name] 09/14/2018, 10:48 AM

## 2018-09-14 NOTE — Progress Notes (Signed)
Pt c/o penis pain, described as throbbing pain. Pt denies burning or tingling with urination. PVR reading of 50 ml. No swelling, reddness or irritation noted. medication was given. Will continue to monitor.
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
## 2018-09-15 ENCOUNTER — Inpatient Hospital Stay (HOSPITAL_COMMUNITY): Payer: BC Managed Care – PPO | Admitting: Occupational Therapy

## 2018-09-15 ENCOUNTER — Inpatient Hospital Stay (HOSPITAL_COMMUNITY): Payer: BC Managed Care – PPO

## 2018-09-15 ENCOUNTER — Inpatient Hospital Stay (HOSPITAL_COMMUNITY): Payer: BC Managed Care – PPO | Admitting: Speech Pathology

## 2018-09-15 ENCOUNTER — Inpatient Hospital Stay (HOSPITAL_COMMUNITY): Payer: BC Managed Care – PPO | Admitting: Physical Therapy

## 2018-09-15 ENCOUNTER — Encounter (HOSPITAL_COMMUNITY): Payer: BC Managed Care – PPO | Admitting: Psychology

## 2018-09-15 DIAGNOSIS — S069X0S Unspecified intracranial injury without loss of consciousness, sequela: Secondary | ICD-10-CM

## 2018-09-15 DIAGNOSIS — R4689 Other symptoms and signs involving appearance and behavior: Secondary | ICD-10-CM

## 2018-09-15 DIAGNOSIS — S069X4D Unspecified intracranial injury with loss of consciousness of 6 hours to 24 hours, subsequent encounter: Secondary | ICD-10-CM

## 2018-09-15 MED ORDER — SERTRALINE HCL 50 MG PO TABS
50.0000 mg | ORAL_TABLET | Freq: Every day | ORAL | Status: DC
  Administered 2018-09-16 – 2018-09-18 (×3): 50 mg via ORAL
  Filled 2018-09-15 (×3): qty 1

## 2018-09-15 MED ORDER — MEGESTROL ACETATE 400 MG/10ML PO SUSP
400.0000 mg | Freq: Every day | ORAL | Status: DC
  Administered 2018-09-16 – 2018-09-18 (×3): 400 mg via ORAL
  Filled 2018-09-15 (×3): qty 10
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
## 2018-09-15 NOTE — Progress Notes (Signed)
Occupational Therapy Session Note  Patient Details  Name: Michael Wilkinson MRN: [id number] Date of Birth: 03-14-1965  Today's Date: 09/15/2018 OT Individual Time: 1100-1134 OT Individual Time Calculation (min): 34 min    Short Term Goals: Week 1:  OT Short Term Goal 1 (Week 1): Pt will complete sit > stand with max +1 assist OT Short Term Goal 2 (Week 1): Pt will don shorts with mod A OT Short Term Goal 3 (Week 1): Pt will tolerate OOB for 2 hours to increase functional activity tolerance OT Short Term Goal 4 (Week 1): Pt will transfer to [facility] with max +1  Skilled Therapeutic Interventions/Progress Updates:    patient in bed, alert, cooperative.  Supine to SSP with CS.  Wife entered the room while he was seated on the bed, he was able to recognize her as she walked in the door.  Reviewed functional gains.  Patient completed UB ROM activities, donning CAM boot & left wrist splint.  Completed sit to stand with CG A x2 able to stand for 1-2 minutes per attempt with good carryover of NWB.  Good unsupported sitting balance and tolerance to activity today.  Reviewed eye mobility, tolerance to light and acuity impairment - noted piece of plastic at left eye lid - nursing aware.  Patient returned to supine position with CS, wife seated edge of bed - did not set bed alarm - nursing aware.    Therapy Documentation Precautions:  Precautions Precautions: Fall Required Braces or Orthoses: Other Brace Other Brace: CAM boot LLE, Wrist splint L UE Restrictions Weight Bearing Restrictions: Yes LLE Weight Bearing: Non weight bearing General:   Vital Signs:   Pain: Pain Assessment Pain Scale: 0-10 Pain Score: 0-No pain   Other Treatments:     Therapy/Group: Individual Therapy  [name] 09/15/2018, 12:17 PM
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
## 2018-09-15 NOTE — Progress Notes (Signed)
Physical Therapy Session Note  Patient Details  Name: Michael Wilkinson MRN: [id number] Date of Birth: 01-Dec-1965  Today's Date: 09/15/2018 PT Individual Time: 1352-1430 PT Individual Time Calculation (min): 38 min   Short Term Goals: Week 1:  PT Short Term Goal 1 (Week 1): Pt will consistently complete bed<>w/c transfers with supervision. PT Short Term Goal 2 (Week 1): Pt will transfer sit<>stand with mod assist +1 & LRAD. PT Short Term Goal 3 (Week 1): Pt will propel w/c 75 ft with supervision. PT Short Term Goal 4 (Week 1): Pt will ambulate 10 ft with LRAD & mod assist +1.  Skilled Therapeutic Interventions/Progress Updates:  Pt received in bed with wife present but exiting upon PT arrival. Pt emotional after his wife left with therapist giving him a second to collect himself and encouragement. Pt transfers to sitting EOB with supervision with HOB elevated. Pt dons CAM boot with assistance, R sock & LUE splint without assistance. Pt completes lateral scoot to w/c with supervision & ongoing cuing for NWB LLE. Pt propels w/c room>nurses station with supervision & therapist transports pt to apartment for time management. Pt transfers w/c>couch with supervision, CGA couch>w/c with lateral scoot with cuing for ensuring he clears his buttocks from seat. Pt requires cuing for management of armrest & total assist for leg rest management. Pt does attempt to transfer to standing with RW & is unable to transfer to standing even with max assist on this date. Educated on pressure relief techniques while sitting in w/c to encourage pt to sit in w/c for longer periods of time with poor return demo of techniques. Pt left in w/c with chair alarm donned & all needs in reach.   Therapy Documentation Precautions:  Precautions Precautions: Fall Required Braces or Orthoses: Other Brace Other Brace: CAM boot LLE, Wrist splint L UE Restrictions Weight Bearing Restrictions: Yes LLE Weight Bearing: Non weight  bearing   Pain: Pt with c/o pain in L wrist at end of session & rest break provided PRN.   Therapy/Group: Individual Therapy  [name] 09/15/2018, 2:38 PM
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
## 2018-09-16 ENCOUNTER — Inpatient Hospital Stay (HOSPITAL_COMMUNITY): Payer: BC Managed Care – PPO

## 2018-09-16 ENCOUNTER — Inpatient Hospital Stay (HOSPITAL_COMMUNITY): Payer: BC Managed Care – PPO | Admitting: Physical Therapy

## 2018-09-16 ENCOUNTER — Inpatient Hospital Stay (HOSPITAL_COMMUNITY): Payer: BC Managed Care – PPO | Admitting: Speech Pathology

## 2018-09-16 NOTE — Progress Notes (Signed)
Physical Therapy Note  Patient Details  Name: Michael Wilkinson MRN: [id number] Date of Birth: Jan 26, 1966 Today's Date: 09/16/2018    Pt received in bed with Recreational therapist present. Pt declines working with this therapist & team made aware. Pt missed 75 minutes skilled PT treatment.  [name] 09/16/2018, 10:47 AM

## 2018-09-16 NOTE — Progress Notes (Signed)
Occupational Therapy Note  Patient Details  Name: Michael Wilkinson MRN: [id number] Date of Birth: [birth year]  Today's Date: 09/16/2018 OT Missed Time: 39 Minutes Missed Time Reason: Patient unwilling/refused to participate without medical reason  Pt missed 45 mins skilled OT services.  Pt declined therapy this afternoon.  Pt stated he was "having a rough day."   [name] 09/16/2018, 1:42 PM

## 2018-09-16 NOTE — Progress Notes (Signed)
SLP Cancellation Note  Patient Details Name: Michael Wilkinson MRN: [id number] DOB: [birth year]   Cancelled treatment:        Pt refused skilled ST, he stated "I am taking a break for today."  Pt missed 45 minutes of skilled ST.                                                                                                 Tiffiney Sparrow 09/16/2018, 2:21 PM
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
## 2018-09-16 NOTE — Progress Notes (Signed)
Occupational Therapy Session Note  Patient Details  Name: Michael Wilkinson MRN: [id number] Date of Birth: 17-Oct-1965  Today's Date: 09/16/2018 OT Individual Time: 0932-6712 OT Individual Time Calculation (min): 30 min  and Today's Date: 09/16/2018 OT Missed Time: 15 Minutes Missed Time Reason: Patient unwilling/refused to participate without medical reason   Short Term Goals: Week 1:  OT Short Term Goal 1 (Week 1): Pt will complete sit > stand with max +1 assist OT Short Term Goal 2 (Week 1): Pt will don shorts with mod A OT Short Term Goal 3 (Week 1): Pt will tolerate OOB for 2 hours to increase functional activity tolerance OT Short Term Goal 4 (Week 1): Pt will transfer to [facility] with max +1  Skilled Therapeutic Interventions/Progress Updates:    Session focused on planning shower transfer at home. Pt initially rude to therapist and saying "I told them I'm not doing anything" and resisting education but after several minutes pt relaxed. Pulled up pt's home shower pictures on computer (wife sent them in) and discussed transfer options. Presented DME options, BSC vs. Shower chair vs. TTB. A TTB was brought into the room to provide demonstration. Pt and therapist agree this is the best option. Pt left supine with all needs met, bed alarm set.    Therapy Documentation Precautions:  Precautions Precautions: Fall Required Braces or Orthoses: Other Brace Other Brace: CAM boot LLE, Wrist splint L UE Restrictions Weight Bearing Restrictions: Yes LLE Weight Bearing: Non weight bearing   Therapy/Group: Individual Therapy  Curtis Sites 09/16/2018, 7:06 AM
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
## 2018-09-16 NOTE — Progress Notes (Signed)
Attempted to see pt for make up therapy session. Pt politely declining any participation including supine therex or repositioning assist for pain relief/comfort. Pt repeatedly stated "I want to be left alone today".   Burnard Bunting, PT, DPT

## 2018-09-16 NOTE — Progress Notes (Signed)
Occupational Therapy Session Note  Patient Details  Name: Michael Wilkinson MRN: [id number] Date of Birth: Jan 07, 1966  Today's Date: 09/16/2018 OT Individual Time: 4098-1191 OT Individual Time Calculation (min): 53 min    Short Term Goals: Week 1:  OT Short Term Goal 1 (Week 1): Pt will complete sit > stand with max +1 assist OT Short Term Goal 2 (Week 1): Pt will don shorts with mod A OT Short Term Goal 3 (Week 1): Pt will tolerate OOB for 2 hours to increase functional activity tolerance OT Short Term Goal 4 (Week 1): Pt will transfer to [facility] with max +1  Skilled Therapeutic Interventions/Progress Updates:    1;1. Pt reporting no pain reported when approached in bed upon arrival. Pt declines bathing, but agreeable to dressing. Pt requires VC for threading BLE at bed level bending LLE closer to figure 4 to thread LE into pants and VC for rolling B v bridging to decrease risk of WB into LLE. Pt sits EOB to don shirt with set up. Pt refuses to don cam boot fully stating, "I cant reach down there." Pt reporting frustration with schedule having so many appointments. Attempted to explain schedule and reasoning as pt may have missed time prior in the week, however pt still upset with so many appointments. Pt reporting need to toilet. Pt completes squat pivot transfer with S EOB<>w/c<>DAC over toilet. Pt requires min A for CM prior to toileting using lateral leans and MAX A for CM after toileting sit to stand with VC for adherence to WB precautions. Upon asking about shower set up and if doors could be removed temporarily d/t WB precautions, pt becomes very agitated repeating, "my wife has it taken care of, do you think we are stupid." Again attempted to explain that it was to practice in prep for d/c home and to A with d/c paperwork but pt continues to be agitated and requests to get back in bed. Lateral scoot transfer with S back to bed with A for w/c parts management provided. Pt left in bed with rec  therapist in room missing 22 min of skilled OT d/t refusal.   Therapy Documentation Precautions:  Precautions Precautions: Fall Required Braces or Orthoses: Other Brace Other Brace: CAM boot LLE, Wrist splint L UE Restrictions Weight Bearing Restrictions: Yes LLE Weight Bearing: Non weight bearing General:   Vital Signs: Therapy Vitals Temp: 98.1 F (36.7 C) Pulse Rate: 82 Resp: 18 BP: 128/85 Patient Position (if appropriate): Lying Oxygen Therapy SpO2: 99 % O2 Device: Room Air Pain: Pain Assessment Pain Scale: 0-10 Pain Score: 0-No pain ADL:   Vision   Perception    Praxis   Exercises:   Other Treatments:     Therapy/Group: Individual Therapy  Tonny Branch 09/16/2018, 8:49 AM

## 2018-09-17 ENCOUNTER — Inpatient Hospital Stay (HOSPITAL_COMMUNITY): Payer: BC Managed Care – PPO

## 2018-09-17 ENCOUNTER — Inpatient Hospital Stay (HOSPITAL_COMMUNITY): Payer: BC Managed Care – PPO | Admitting: Speech Pathology

## 2018-09-17 ENCOUNTER — Inpatient Hospital Stay (HOSPITAL_COMMUNITY): Payer: BC Managed Care – PPO | Admitting: Occupational Therapy

## 2018-09-17 DIAGNOSIS — R1312 Dysphagia, oropharyngeal phase: Secondary | ICD-10-CM

## 2018-09-17 DIAGNOSIS — S069X9S Unspecified intracranial injury with loss of consciousness of unspecified duration, sequela: Secondary | ICD-10-CM

## 2018-09-17 NOTE — Progress Notes (Signed)
Occupational Therapy Note  Patient Details  Name: Michael Wilkinson MRN: [id number] Date of Birth: 1965/09/11  Today's Date: 09/17/2018 OT Missed Time: 74 Minutes Missed Time Reason: Pain(migraine)  Pt resting in bed upon arrival.  Pt pleasant but when therapist discussed purpose of visit pt stated he couldn't do anything because he had a migraine.  Pt pulled covers up over chest. Pt missed 60 mins skilled OT services.   Leotis Shames [facility] 09/17/2018, 8:43 AM

## 2018-09-17 NOTE — Progress Notes (Signed)
Patient ID: Michael Wilkinson, male   DOB: Sep 05, 1965, 53 y.o.   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  A walker or cane will not resolve issue with performing activities of daily living.  A wheelchair will allow patient to safely perform daily activities.  Patient is not able to propel himself in the home using a standard weight wheelchair due to arm weakness and endurance.  Patient can self propel in the lightweight wheelchair.

## 2018-09-17 NOTE — Progress Notes (Signed)
Physical Therapy Session Note  Patient Details  Name: Michael Wilkinson MRN: [id number] Date of Birth: 02-11-1966  Today's Date: 09/17/2018   -      Short Term Goals: Week 1:  PT Short Term Goal 1 (Week 1): Pt will consistently complete bed<>w/c transfers with supervision. PT Short Term Goal 2 (Week 1): Pt will transfer sit<>stand with mod assist +1 & LRAD. PT Short Term Goal 3 (Week 1): Pt will propel w/c 75 ft with supervision. PT Short Term Goal 4 (Week 1): Pt will ambulate 10 ft with LRAD & mod assist +1.  Skilled Therapeutic Interventions/Progress Updates:   Pt resting in bed.  Pt declined participating, due to HA.  PT offered bedside strengthening exercises, but pt continued to decline.      Therapy Documentation Precautions:  Precautions Precautions: Fall Required Braces or Orthoses: Other Brace Other Brace: CAM boot LLE, Wrist splint L UE Restrictions Weight Bearing Restrictions: Yes LLE Weight Bearing: Non weight bearing General: PT Amount of Missed Time (min): 60 Minutes PT Missed Treatment Reason: Patient unwilling to participate  Pain: Pain Assessment Pain Scale: 0-10 Pain Score: 9  Pain Location: Head Pain Intervention(s): Refused    Therapy/Group: Individual Therapy  Willard Farquharson 09/17/2018, 1:08 PM
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
## 2018-09-17 NOTE — Progress Notes (Signed)
Occupational Therapy Session Note  Patient Details  Name: Michael Wilkinson MRN: [id number] Date of Birth: [birth year]  Today's Date: 09/17/2018 OT Individual Time:  -       Short Term Goals: Week 1:  OT Short Term Goal 1 (Week 1): Pt will complete sit > stand with max +1 assist OT Short Term Goal 2 (Week 1): Pt will don shorts with mod A OT Short Term Goal 3 (Week 1): Pt will tolerate OOB for 2 hours to increase functional activity tolerance OT Short Term Goal 4 (Week 1): Pt will transfer to [facility] with max +1  Skilled Therapeutic Interventions/Progress Updates:    patient c/o HA and sore throat, declines therapy - missed 30 minutes of OT   Therapy Documentation Precautions:  Precautions Precautions: Fall Required Braces or Orthoses: Other Brace Other Brace: CAM boot LLE, Wrist splint L UE Restrictions Weight Bearing Restrictions: Yes LLE Weight Bearing: Non weight bearing General: General OT Amount of Missed Time: 30 Minutes Vital Signs:   Pain: Pain Assessment Pain Scale: 0-10 Pain Score: 9  Pain Location: Head Pain Intervention(s): Refused Other Treatments:     Therapy/Group: Individual Therapy  [name] 09/17/2018, 12:07 PM

## 2018-09-17 NOTE — Progress Notes (Signed)
Speech Language Pathology Daily Session Note  Patient Details  Name: Kion Huntsberry MRN: [id number] Date of Birth: [birth year]  Today's Date: 09/17/2018 SLP Missed Time: 61 Minutes Missed Time Reason: Patient unwilling to participate  Pt refused skilled ST, no excuse provided.   Pain Pain Assessment Pain Scale: 0-10 Pain Score: 9  Pain Location: Head Pain Intervention(s): Refused  Therapy/Group: Individual Therapy  Donatella Walski 09/17/2018, 1:45 PM

## 2018-09-18 ENCOUNTER — Encounter (HOSPITAL_COMMUNITY): Payer: BC Managed Care – PPO

## 2018-09-18 ENCOUNTER — Encounter (HOSPITAL_COMMUNITY): Payer: BC Managed Care – PPO | Admitting: Speech Pathology

## 2018-09-18 ENCOUNTER — Ambulatory Visit (HOSPITAL_COMMUNITY): Payer: BC Managed Care – PPO | Admitting: Physical Therapy

## 2018-09-18 MED ORDER — MEGESTROL ACETATE 400 MG/10ML PO SUSP: 400.0000 mg | Freq: Every day | ORAL | 0 refills | Status: DC

## 2018-09-18 MED ORDER — NAPHAZOLINE-GLYCERIN 0.012-0.2 % OP SOLN: 1.0000 [drp] | Freq: Four times a day (QID) | OPHTHALMIC | 0 refills | Status: AC | PRN

## 2018-09-18 MED ORDER — TOPIRAMATE 25 MG PO TABS: 25.0000 mg | ORAL_TABLET | Freq: Two times a day (BID) | ORAL | 1 refills | Status: AC

## 2018-09-18 MED ORDER — AMLODIPINE BESYLATE 5 MG PO TABS: 10.0000 mg | ORAL_TABLET | Freq: Every day | ORAL | 1 refills | Status: AC

## 2018-09-18 MED ORDER — ATORVASTATIN CALCIUM 10 MG PO TABS: 10.0000 mg | ORAL_TABLET | Freq: Every day | ORAL | 1 refills | Status: AC

## 2018-09-18 MED ORDER — METHOCARBAMOL 500 MG PO TABS: 500.0000 mg | ORAL_TABLET | Freq: Four times a day (QID) | ORAL | 1 refills | Status: AC | PRN

## 2018-09-18 MED ORDER — POTASSIUM CHLORIDE CRYS ER 20 MEQ PO TBCR: 20.0000 meq | EXTENDED_RELEASE_TABLET | Freq: Every day | ORAL | 1 refills | Status: AC

## 2018-09-18 MED ORDER — ACETAMINOPHEN 325 MG PO TABS: 325.0000 mg | ORAL_TABLET | ORAL | Status: AC | PRN

## 2018-09-18 MED ORDER — SERTRALINE HCL 50 MG PO TABS: 25.0000 mg | ORAL_TABLET | Freq: Every day | ORAL | 1 refills | Status: AC

## 2018-09-18 MED ORDER — OXYCODONE HCL 5 MG PO TABS: 5.0000 mg | ORAL_TABLET | Freq: Two times a day (BID) | ORAL | 0 refills | Status: AC | PRN

## 2018-09-18 MED ORDER — MAGNESIUM GLUCONATE 500 MG PO TABS: 500.0000 mg | ORAL_TABLET | Freq: Every day | ORAL | 1 refills | Status: AC

## 2018-09-18 MED ORDER — ERYTHROMYCIN 5 MG/GM OP OINT: TOPICAL_OINTMENT | OPHTHALMIC | 0 refills | Status: DC

## 2018-09-18 MED ORDER — LEVETIRACETAM 750 MG PO TABS: 750.0000 mg | ORAL_TABLET | Freq: Two times a day (BID) | ORAL | 1 refills | Status: AC

## 2018-09-18 NOTE — Progress Notes (Addendum)
Social Work Discharge Note   The overall goal for the admission was met for:   Discharge location: Yes - home  Length of Stay: Yes - 10 days  Discharge activity level: Yes - minimal assistance  Home/community participation: Yes  Services provided included: MD, RD, PT, OT, SLP, RN, Pharmacy, Neuropsych and SW  Financial Services: Private Insurance: [employer]  Follow-up services arranged: Home Health: PT/OT/ST from [facility] , DME: 20"x18" lightweight wheelchair with ELRs and basic cushion; rolling walker with left platform; wide drop arm commode from [employer] and Patient/Family has no preference for HH/DME agencies  Comments (or additional information): Pt received family education and feels prepared to care for pt at home.   Patient/Family verbalized understanding of follow-up arrangements: Yes  Individual responsible for coordination of the follow-up plan: pt's wife, Catherine Ladd [phone]  Confirmed correct DME delivered: Prevatt, Jennifer Capps 09/18/2018    Prevatt, Jennifer Capps 

## 2018-09-18 NOTE — Progress Notes (Signed)
Occupational Therapy Discharge Summary  Patient Details  Name: Michael Wilkinson MRN: [id number] Date of Birth: 17-Jun-1965  Patient has met 4 of 8 long term goals due to improved activity tolerance, improved balance, ability to compensate for deficits and functional use of  LEFT upper extremity.  Patient to discharge at [facility] Assist level.  Patient's care partner declined hands on education on transfers, ADLs, or DME use/set up in home shower d/t pt wife being ICU RN for 18 years.    Reasons goals not met: pt requires min A for LB dressing, toilet, and shower transfers at this time and constant cuing for WB precauitons d/t decreased awareness  Recommendation:  Patient will benefit from ongoing skilled OT services in home health setting to continue to advance functional skills in the area of BADL.  Equipment: Drop arm bsc  Reasons for discharge: discharge from hospital  Patient/family agrees with progress made and goals achieved: Yes  OT Discharge Vision Baseline Vision/History: Wears glasses Wears Glasses: Reading only Patient Visual Report: Blurring of vision Vision Assessment?: Yes Eye Alignment: Within Functional Limits Ocular Range of Motion: Within Functional Limits Alignment/Gaze Preference: Within Defined Limits Tracking/Visual Pursuits: Decreased smoothness of horizontal tracking;Decreased smoothness of vertical tracking Saccades: Within functional limits Convergence: Within functional limits Perception  Perception: Within Functional Limits Praxis Praxis: Intact Cognition Overall Cognitive Status: Impaired/Different from baseline Arousal/Alertness: Awake/alert Orientation Level: Oriented X4 Attention: Sustained Sustained Attention: Appears intact Memory: Impaired Memory Impairment: Storage deficit;Decreased recall of new information;Decreased short term memory Decreased Short Term Memory: Functional basic Awareness: Impaired Awareness Impairment: Intellectual  impairment Problem Solving: Impaired Problem Solving Impairment: Functional basic Behaviors: Poor frustration tolerance Safety/Judgment: Impaired [employer] Scales of Cognitive Functioning: Purposeful/appropriate Sensation Sensation Light Touch: Impaired Detail Peripheral sensation comments: pt reports numbness in first 3 digits on L hand, sensation intact in LLE Coordination Gross Motor Movements are Fluid and Coordinated: No Fine Motor Movements are Fluid and Coordinated: No Coordination and Movement Description: generalized weakness, poor adherence to weight bearing precautions Motor  Motor Motor - Skilled Clinical Observations: generalized weakness, mild L hemi Trunk/Postural Assessment  Cervical Assessment Cervical Assessment: Exceptions to WFL(forward head) Thoracic Assessment Thoracic Assessment: Within Functional Limits Lumbar Assessment Lumbar Assessment: Exceptions to WFL(posterior pelvic tilt) Postural Control Righting Reactions: delayed Protective Responses: delayed  Balance Static Sitting Balance Static Sitting - Balance Support: Feet supported Static Sitting - Level of Assistance: 5: Stand by assistance Dynamic Sitting Balance Dynamic Sitting - Balance Support: Feet supported Dynamic Sitting - Level of Assistance: 5: Stand by assistance Extremity/Trunk Assessment RUE Assessment RUE Assessment: Exceptions to [facility] General Strength Comments: 4-/5 MMT LUE Assessment LUE Assessment: Exceptions to [facility] General Strength Comments: 3-/5 L UE, L shoulder flexion: 70 degees, elbow cannot fully extend to neutral LUE Body System: Neuro Brunstrum levels for arm and hand: Hand;Arm Brunstrum level for arm: Stage V Relative Independence from [employer] Brunstrum level for hand: Stage VI Isolated joint movements   [name] 09/18/2018, 6:36 AM

## 2018-09-18 NOTE — Progress Notes (Signed)
Patient and spouse received discharge instructions from this RN with verbal understanding. Patient received all equipment and prescriptions were faxed to pharmacy of choice. Patient discharged to home with wife and belongings.

## 2018-09-18 NOTE — Progress Notes (Signed)
Occupational Therapy Note  Patient Details  Name: Michael Wilkinson MRN: [id number] Date of Birth: 06-26-1965  Pt would benefit from bariatric [facility] to improve safety and independence with toileting and toilet transfers.The greater surface area allows improved push off when scooting across and provides A wider area for lateral leans during CM to allow pt to maintain WB precautions.   Michael Wilkinson 09/18/2018, 10:49 AM

## 2018-09-18 NOTE — Progress Notes (Signed)
Social Work Patient ID: Michael Wilkinson, male   DOB: 1965-06-16, 53 y.o.   MRN: [id number]   CSW met with pt and spoke with pt's wife via telephone to update them on team conference discussion and targeted d/c date of 09-18-18 after therapy/family education.  Pt is ready to go home and wife is prepared to have pt home.  She has taken an 8 week FMLA.  CSW will arrange [facility] and order DME.  CSW allowed wife to share a frustrating interaction she had with a therapist and asked CSW to report this on her/pt's behalf.  CSW reported this to clinical therapy specialist and she was aware of this from the therapist who had already self reported this to specialist.  New therapist was assigned.  CSW will continue to follow and assist as needed.

## 2018-09-18 NOTE — Progress Notes (Signed)
[place] PHYSICAL MEDICINE & REHABILITATION PROGRESS NOTE   Subjective/Complaints: Lying in bed. Anxious to get home. No new complaints this morning. Wants to shave  ROS: Patient denies fever, rash, sore throat, blurred vision, nausea, vomiting, diarrhea, cough, shortness of breath or chest pain, joint or back pain, headache, or mood change.   Objective:   No results found. No results for input(s): WBC, HGB, HCT, PLT in the last 72 hours. No results for input(s): NA, K, CL, CO2, GLUCOSE, BUN, CREATININE, CALCIUM in the last 72 hours.  Intake/Output Summary (Last 24 hours) at 09/18/2018 0933 Last data filed at 09/18/2018 0854 Gross per 24 hour  Intake 960 ml  Output 3200 ml  Net -2240 ml     Physical Exam: Vital Signs Blood pressure (!) 156/80, pulse 88, temperature 98.4 F (36.9 C), temperature source Oral, resp. rate 20, height 5\' 11"  (1.803 m), weight 89.7 kg, SpO2 98 %. Constitutional: No distress . Vital signs reviewed. HEENT: EOMI, oral membranes moist Neck: supple Cardiovascular: RRR without murmur. No JVD    Respiratory: CTA Bilaterally without wheezes or rales. Normal effort    GI: BS +, non-tender, non-distended   Musc: No edema or tenderness in extremities. Neurological: Alert Slow processing Motor: Bilateral upper extremities: Grossly 4+/5 Right lower extremity: RLE 3+/5 proximal distal Left lower extremity: Hip flexion, knee extension 3/5, ankle dorsiflexion 2+/5 Skin: abdominal incision closing with clean granulation tissue inferiorly Psychiatric: a little more anxious this morning   Assessment/Plan: 1. 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  Physiatrist is providing close team supervision and 24 hour management of active medical problems listed below.  Physiatrist and rehab team continue to assess barriers to discharge/monitor patient  progress toward functional and medical goals  Care Tool:  Bathing    Body parts bathed by patient: Right arm, Left arm, Chest, Abdomen, Right upper leg, Left upper leg, Face, Front perineal area, Buttocks, Right lower leg, Left lower leg   Body parts bathed by helper: Buttocks, Right lower leg, Left lower leg     Bathing assist Assist Level: Contact Guard/Touching assist     Upper Body Dressing/Undressing Upper body dressing   What is the patient wearing?: Pull over shirt    Upper body assist Assist Level: Supervision/Verbal cueing    Lower Body Dressing/Undressing Lower body dressing      What is the patient wearing?: Pants     Lower body assist Assist for lower body dressing: Supervision/Verbal cueing     Toileting Toileting    Toileting assist Assist for toileting: Maximal Assistance - Patient 25 - 49%     Transfers Chair/bed transfer  Transfers assist  Chair/bed transfer activity did not occur: Safety/medical concerns  Chair/bed transfer assist level: Supervision/Verbal cueing(lateral scoot)     Locomotion Ambulation   Ambulation assist   Ambulation activity did not occur: Safety/medical concerns          Walk 10 feet activity   Assist  Walk 10 feet activity did not occur: Safety/medical concerns        Walk 50 feet activity   Assist Walk 50 feet with 2 turns activity did not occur: Safety/medical concerns         Walk 150 feet activity   Assist Walk 150 feet activity did not occur: Safety/medical concerns         Walk 10 feet on uneven surface  activity  Assist Walk 10 feet on uneven surfaces activity did not occur: Safety/medical concerns         Wheelchair     Assist Will patient use wheelchair at discharge?: Yes Type of Wheelchair: Manual Wheelchair activity did not occur: Refused  Wheelchair assist level: Supervision/Verbal cueing Max wheelchair distance: 125 ft    Wheelchair 50 feet with 2 turns  activity    Assist    Wheelchair 50 feet with 2 turns activity did not occur: Refused   Assist Level: Supervision/Verbal cueing   Wheelchair 150 feet activity     Assist Wheelchair 150 feet activity did not occur: Refused        Medical Problem List and Plan: 1.Functional deficitssecondary toTBI/polytrauma  Continue CIR  -family ed completed.  -dc today  -Patient to see Rehab MD/provider in the office for transitional care encounter in 1-2 weeks.   [facility], Dr. Ezra SitesHalvorson's office [phone])   -has appt 8/11 at 2:45 for follow up xrays, etc 2. Antithrombotics: -DVT/anticoagulation:Pharmaceutical:Lovenox -antiplatelet therapy: ASA daily 3. Pain Management:Oxycodone prn.  Stable with medications on 8/7 4. Mood:LCSW to follow for evaluation and support. -antipsychotic agents: N/A  -continue zoloft 50mg  daily  -anxious to get home. Should do better at home from this standpoint  -[facility] 5. Neuropsych: This patientis not fully capable of making decisions on hisown behalf. 6. Skin/Wound Care:wet to dry to dressing to abdominal wound.  -wet to dry dressing to abdomen---wound closing 7. Fluids/Electrolytes/Nutrition:  -potassium up to 4.1  -continue PO for healing/etc  -dc megace  8. Acute on chronic respiratory failure: Continue prn nebs. 9. Multiple facial fractures with orbital hematoma:   follow up with opth after discharge.  10.ABLA: Improving  Hemoglobin 12.9   11. Left tib/fib fractures s/p ORIF: NWB LLE with bootper order  -surgery date 07/20/2018 -pt will see ortho next week as above 12. NSTEMI: treated medically with Lipitor and ASA.  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   -continue abx drops, changed to daytime only on 8/2.   .  -eye patch for at night OS recommended 14.  Dysphagia  D3 thins, advance  diet as tolerated, tolerating well  LOS: 10 days A FACE TO FACE EVALUATION WAS PERFORMED  Ranelle OysterZachary T Keyonni Percival 09/18/2018, 9:33 AM

## 2018-09-18 NOTE — Progress Notes (Signed)
Speech Language Pathology Discharge Summary  Patient Details  Name: Michael Wilkinson MRN: [id number] Date of Birth: 1965-04-07  Today's Date: 09/18/2018 SLP Individual Time: 1100-1109 SLP Individual Time Calculation (min): 9 min   Skilled Therapeutic Interventions:  Skilled treatment session focused on education with pt and his wife. Upon entering pt's room, pt's wife had mask hanging under her chin. Initially, she appeared to bring mask up into appropriate position but then left it under her chin. Per chart, pt's wife has refused to comply with facility requirements regarding mask usage. As evidenced by her conscious behavior not to put mask into place today, SLP answered any questions they had (questions only regarded receiving clean urinal to take home) and then SLP discontinued session. CSW and DON made aware of wife's behavior. Pt refused HHST for follow up.       Patient has met 3 of 3 long term goals.  Patient to discharge at overall Supervision level.  Reasons goals not met:   N/A  Clinical Impression/Discharge Summary:   Pt has made some initial progress in skilled ST session but most recently he has refused most therapies. While HHST would be a good service for continued progress within home environment, pt states that he will refuse any follow up services.  Care Partner:  Caregiver Able to Provide Assistance: Yes  Type of Caregiver Assistance: Physical;Cognitive  Recommendation:  None      Equipment:   N/A  Reasons for discharge: Discharged from hospital   Patient/Family Agrees with Progress Made and Goals Achieved: Yes    [name] 09/18/2018, 11:20 AM
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
## 2018-09-18 NOTE — Progress Notes (Signed)
Physical Therapy Discharge Summary  Patient Details  Name: Michael Wilkinson MRN: [id number] Date of Birth: 12-06-1965  Today's Date: 09/18/2018 PT Individual Time: 0905-1000 PT Individual Time Calculation (min): 55 min    Patient has met 5 of 10 long term goals due to improved activity tolerance, improved balance, increased strength, decreased pain, ability to compensate for deficits and improved awareness.  Patient to discharge at a wheelchair level Supervision.   Patient's care partner is independent to provide the necessary physical and cognitive assistance at discharge.  Reasons goals not met: Pt continues to required at least supervision assist for all mobility to maintain NWB in the LLE with poor cary over.   Recommendation:  Patient will benefit from ongoing skilled PT services in home health setting to continue to advance safe functional mobility, address ongoing impairments in balance, safety, gait, transfers, and minimize fall risk.  Equipment: PFRW, WC.   Reasons for discharge: discharge from hospital  Patient/family agrees with progress made and goals achieved: Yes   PT treatment:   Pt received supine in bed and agreeable to PT treatment, with significant encouragement from PT. Pt donned shorts at bed level with supervision assist for safety from PT, constant cues for NWB in the LLE as pt rolled to pull pants to waist. Supine>sit transfer without  assist or cues from PT. Pt donned shirt EOB without assist from PT. Lateral scoot/squat pivot transfer to [facility] with supervision assist and max cues for prevent WB on the LLE. WC mobility through hall x 165f with supervision assist and BUE propulsion; pt encouraged to use RLE for propulsion to reduce stress on LUE, but refused. PT set pt up with platform RW on the L with L wrist pain. Stand pivot transfer to car with PFRW and min assist for safety and max cues for NWB in the LLE, which pt was unable to manitain. Pt's wife performed transfer  [facility] technique to lower surface. Pt unwilling to attempt transfer to car again with wife, and she stated that she felt comfortable with useinf PFRW to transfer pt into [place] Bed transfer to bed in apartment with supervision assist, as pt unable to maintain NWB. Patient returned to room and [facility] reach and all needs met.      PT Discharge Precautions/Restrictions NWB LLE. LUE hand splint  Pain Pain Assessment Pain Scale: 0-10 Pain Score: 0-No pain Vision/Perception  Vision - Assessment Eye Alignment: Within Functional Limits Ocular Range of Motion: Within Functional Limits Alignment/Gaze Preference: Within Defined Limits Tracking/Visual Pursuits: Decreased smoothness of horizontal tracking;Decreased smoothness of vertical tracking Saccades: Within functional limits Convergence: Within functional limits Perception Perception: Within Functional Limits Praxis Praxis: Intact  Cognition Overall Cognitive Status: Impaired/Different from baseline Arousal/Alertness: Awake/alert Orientation Level: Oriented X4 Attention: Sustained Sustained Attention: Appears intact Memory: Impaired Memory Impairment: Storage deficit;Decreased recall of new information;Decreased short term memory Decreased Short Term Memory: Functional basic Awareness: Impaired Awareness Impairment: Intellectual impairment Problem Solving: Impaired Problem Solving Impairment: Functional basic Behaviors: Poor frustration tolerance Safety/Judgment: Impaired Rancho LDuke EnergyScales of Cognitive Functioning: Purposeful/appropriate Sensation Sensation Light Touch: Impaired Detail Peripheral sensation comments: pt reports numbness in first 3 digits on L hand, sensation intact in LLE Coordination Gross Motor Movements are Fluid and Coordinated: No Fine Motor Movements are Fluid and Coordinated: No Coordination and Movement Description: generalized weakness,  poor adherence to weight bearing precautions Motor  Motor Motor - Skilled Clinical Observations: generalized weakness, mild  L hemi  Mobility Bed Mobility Bed Mobility: Rolling Right;Rolling Left;Supine to Sit;Sit to Supine Rolling Right: Supervision/verbal cueing Rolling Left: Supervision/Verbal cueing Supine to Sit: Supervision/Verbal cueing Sit to Supine: Supervision/Verbal cueing Transfers Transfers: Squat Pivot Transfers;Stand Pivot Transfers Sit to Stand: Minimal Assistance - Patient > 75% Stand to Sit: Minimal Assistance - Patient > 75% Stand Pivot Transfers: Minimal Assistance - Patient > 75% Stand Pivot Transfer Details (indicate cue type and reason): with PFRW Squat Pivot Transfers: Supervision/Verbal cueing Lateral/Scoot Transfers: Supervision/Verbal cueing Locomotion  Gait Ambulation: Yes Gait Assistance: Minimal Assistance - Patient > 75% Gait Distance (Feet): 3 Feet Assistive device: Left platform walker Gait Assistance Details: Verbal cues for technique;Verbal cues for precautions/safety;Verbal cues for gait pattern;Verbal cues for safe use of DME/AE Gait Assistance Details: Pt unable to maintain NWB in the LLE> Stairs / Additional Locomotion Stairs: No(per wife, Pt has ramp at home.) Wheelchair Mobility Wheelchair Mobility: Yes Wheelchair Assistance: Chartered loss adjuster: Both upper extremities Wheelchair Parts Management: Supervision/cueing Distance: 100  Trunk/Postural Assessment  Cervical Assessment Cervical Assessment: Exceptions to WFL(forward head) Thoracic Assessment Thoracic Assessment: Within Functional Limits Lumbar Assessment Lumbar Assessment: Exceptions to WFL(posterior pelvic tilt) Postural Control Righting Reactions: delayed Protective Responses: delayed  Chiropodist Sitting - Balance Support: Feet supported Static Sitting - Level of Assistance: 5: Stand by assistance Dynamic Sitting  Balance Dynamic Sitting - Balance Support: Feet supported Dynamic Sitting - Level of Assistance: 5: Stand by assistance Extremity Assessment  RUE Assessment RUE Assessment: Exceptions to [facility] General Strength Comments: 4-/5 MMT LUE Assessment LUE Assessment: Exceptions to [facility] General Strength Comments: 3-/5 L UE, L shoulder flexion: 70 degees, elbow cannot fully extend to neutral LUE Body System: Neuro Brunstrum levels for arm and hand: Hand;Arm Brunstrum level for arm: Stage V Relative Independence from Synergy Brunstrum level for hand: Stage VI Isolated joint movements RLE Assessment General Strength Comments: at least 4/5 through funcitonal movement including sit<>stand lateral scoot transfers LLE Assessment General Strength Comments: at least 3/5 in Cam boot with functional movement including transfers and bed mobility    Lorie Phenix 09/18/2018, 10:04 AM

## 2018-09-18 NOTE — Progress Notes (Signed)
Occupational Therapy Session Note  Patient Details  Name: Michael Wilkinson MRN: [id number] Date of Birth: 1966-02-06  Today's Date: 09/18/2018 OT Individual Time: 1000-1030 OT Individual Time Calculation (min): 30 min  and Today's Date: 09/18/2018 OT Missed Time: 30 Minutes Missed Time Reason: Patient unwilling/refused to participate without medical reason   Short Term Goals: Week 1:  OT Short Term Goal 1 (Week 1): Pt will complete sit > stand with max +1 assist OT Short Term Goal 2 (Week 1): Pt will don shorts with mod A OT Short Term Goal 3 (Week 1): Pt will tolerate OOB for 2 hours to increase functional activity tolerance OT Short Term Goal 4 (Week 1): Pt will transfer to [facility] with max +1  Skilled Therapeutic Interventions/Progress Updates:    Pt resting in w/c with girlfriend present.  Pt declined practicing drop arm BSC transfers stating that he has done it "too many" times.  Pt already dressed from earlier therapy session.  Educated pt and girlfriend on weight bearing precautions.  Girlfriend stated she has been an ICU nurse for 18 years and has a good understanding.  Equipment delivered.  Girlfriend stated she was told they were getting a wide drop arm BSC.  CSW notified and arrangements made to be swapped out. PFRW adjusted.  Pt remained seated in w/c with girlfriend present. PT declined active participation.  Education completed.   Therapy Documentation Precautions:  Precautions Precautions: Fall Required Braces or Orthoses: Other Brace Other Brace: CAM boot LLE, Wrist splint L UE Restrictions Weight Bearing Restrictions: Yes LLE Weight Bearing: Non weight bearing General: General OT Amount of Missed Time: 30 Minutes  Pain: Pt generalized discomfort, unrated; emotional support  Therapy/Group: Individual Therapy  [name] 09/18/2018, 10:46 AM

## 2018-09-22 NOTE — Discharge Instructions (Signed)
Inpatient Rehab Discharge Instructions  [place] Discharge date and time:  09/18/18   Activities/Precautions/ Functional Status: Activity: no lifting, driving, or strenuous exercise for till cleared by MD Diet: cardiac diet Abdominal wound Care:Apply hydrogel to wound and cover with dry dressing. Change daily. Keep wound clean and dry    Functional status:  ___ No restrictions     ___ Walk up steps independently _X__ 24/7 supervision/assistance   ___ Walk up steps with assistance ___ Intermittent supervision/assistance  ___ Bathe/dress independently ___ Walk with walker     ___ Bathe/dress with assistance ___ Walk Independently    ___ Shower independently ___ Walk with assistance    _X__ Shower with assistance _X__ No alcohol     ___ Return to work/school ________   COMMUNITY REFERRALS UPON DISCHARGE:   Home Health:   PT     OT     ST      Agency:  [facility] Phone:  [phone] Medical Equipment/Items Ordered:  20"x18" lightweight wheelchair with elevating leg rests and basic cushion; rolling walker; drop arm commode  Agency/Supplier:  [employer]      Phone:  [phone]  Special Instructions: 1. No weight on right leg. 2. Apply vitamin E oil to abrasions and can cover open abraded areas with dry dressings. 3. Needs to follow up with PCP in next 1-2 weeks for post hospital follow up.    My questions have been answered and I understand these instructions. I will adhere to these goals and the provided educational materials after my discharge from the hospital.  Patient/Caregiver Signature _______________________________ Date __________  Clinician Signature _______________________________________ Date __________  Please bring this form and your medication list with you to all your follow-up doctor's appointments.

## 2018-10-06 ENCOUNTER — Encounter: Payer: BC Managed Care – PPO | Attending: Registered Nurse | Admitting: Registered Nurse

## 2018-10-06 ENCOUNTER — Other Ambulatory Visit: Payer: Self-pay

## 2018-10-06 ENCOUNTER — Encounter: Payer: Self-pay | Admitting: Registered Nurse

## 2018-10-06 VITALS — BP 129/82 | HR 86 | Temp 97.9°F | Resp 16

## 2018-10-06 DIAGNOSIS — S82202S Unspecified fracture of shaft of left tibia, sequela: Secondary | ICD-10-CM | POA: Diagnosis not present

## 2018-10-06 DIAGNOSIS — H548 Legal blindness, as defined in USA: Secondary | ICD-10-CM | POA: Insufficient documentation

## 2018-10-06 DIAGNOSIS — S82402S Unspecified fracture of shaft of left fibula, sequela: Secondary | ICD-10-CM | POA: Diagnosis not present

## 2018-10-06 DIAGNOSIS — S06309S Unspecified focal traumatic brain injury with loss of consciousness of unspecified duration, sequela: Secondary | ICD-10-CM | POA: Insufficient documentation
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
## 2018-10-07 ENCOUNTER — Telehealth: Payer: Self-pay | Admitting: Registered Nurse

## 2018-10-07 NOTE — Telephone Encounter (Signed)
Spoke with Dr. Naaman Plummer this morning regarding Michael Wilkinson decision not to F/U in our office. Dr. Naaman Plummer recommends for Michael Wilkinson to be seen in our office with himself or Dr. Dagoberto Ligas. Placed a call to Michael Wilkinson home and mobile number, left message awaiting a return call.

## 2018-10-08 ENCOUNTER — Telehealth: Payer: Self-pay

## 2018-10-08 NOTE — Telephone Encounter (Signed)
Called stating returning [place] call

## 2018-10-09 NOTE — Telephone Encounter (Signed)
Placed a call to Ms. Freeland regarding Dr. Naaman Plummer decision for F/U visit. Mrs. Freund states Mr. Heupel expressed disappointment with Dr. Naaman Plummer . She states  Her husband reported to her ( Mrs. Casher) he was complaining about his eyesight and was told  His eyesight would return. His wife made an  opthomologist appointment and Mr. Barish is legally blind, was told his  Left lens was damaged.     He is following up with Neurology, neurosurgery, trauma, ophthalmology and orthopedics.  At this time no follow up appointment will be scheduled per their decision. Emotional support provided. Zorita Pang is aware of the above.
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
